# Patient Record
Sex: Male | Born: 1980 | Race: Black or African American | Hispanic: No | Marital: Single | Smoking: Former smoker
Health system: Southern US, Community
[De-identification: ages and names within clinical notes are randomized; demographics above are authoritative.]

## PROBLEM LIST (undated history)

## (undated) DIAGNOSIS — L089 Local infection of the skin and subcutaneous tissue, unspecified: Secondary | ICD-10-CM

## (undated) DIAGNOSIS — B958 Unspecified staphylococcus as the cause of diseases classified elsewhere: Secondary | ICD-10-CM

## (undated) DIAGNOSIS — S62609A Fracture of unspecified phalanx of unspecified finger, initial encounter for closed fracture: Secondary | ICD-10-CM

## (undated) DIAGNOSIS — T7840XA Allergy, unspecified, initial encounter: Secondary | ICD-10-CM

## (undated) DIAGNOSIS — I10 Essential (primary) hypertension: Secondary | ICD-10-CM

## (undated) HISTORY — DX: Allergy, unspecified, initial encounter: T78.40XA

---
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
## 2001-03-18 ENCOUNTER — Emergency Department (HOSPITAL_COMMUNITY): Admission: EM | Admit: 2001-03-18 | Discharge: 2001-03-18 | Payer: Self-pay | Admitting: *Deleted

## 2001-09-25 ENCOUNTER — Emergency Department (HOSPITAL_COMMUNITY): Admission: EM | Admit: 2001-09-25 | Discharge: 2001-09-25 | Payer: Self-pay | Admitting: Emergency Medicine

## 2001-12-10 ENCOUNTER — Emergency Department (HOSPITAL_COMMUNITY): Admission: EM | Admit: 2001-12-10 | Discharge: 2001-12-10 | Payer: Self-pay | Admitting: Emergency Medicine

## 2002-07-29 ENCOUNTER — Emergency Department (HOSPITAL_COMMUNITY): Admission: EM | Admit: 2002-07-29 | Discharge: 2002-07-29 | Payer: Self-pay | Admitting: Emergency Medicine

## 2002-08-27 ENCOUNTER — Emergency Department (HOSPITAL_COMMUNITY): Admission: AC | Admit: 2002-08-27 | Discharge: 2002-08-27 | Payer: Self-pay | Admitting: Emergency Medicine

## 2002-08-27 ENCOUNTER — Encounter: Payer: Self-pay | Admitting: Emergency Medicine

## 2003-05-18 ENCOUNTER — Emergency Department (HOSPITAL_COMMUNITY): Admission: EM | Admit: 2003-05-18 | Discharge: 2003-05-18 | Payer: Self-pay | Admitting: Emergency Medicine

## 2003-05-23 ENCOUNTER — Emergency Department (HOSPITAL_COMMUNITY): Admission: EM | Admit: 2003-05-23 | Discharge: 2003-05-23 | Payer: Self-pay

## 2003-09-21 ENCOUNTER — Emergency Department (HOSPITAL_COMMUNITY): Admission: EM | Admit: 2003-09-21 | Discharge: 2003-09-22 | Payer: Self-pay | Admitting: Emergency Medicine

## 2004-01-05 ENCOUNTER — Emergency Department (HOSPITAL_COMMUNITY): Admission: EM | Admit: 2004-01-05 | Discharge: 2004-01-05 | Payer: Self-pay | Admitting: Emergency Medicine

## 2004-03-05 ENCOUNTER — Emergency Department (HOSPITAL_COMMUNITY): Admission: EM | Admit: 2004-03-05 | Discharge: 2004-03-05 | Payer: Self-pay | Admitting: Emergency Medicine

## 2005-07-12 ENCOUNTER — Emergency Department (HOSPITAL_COMMUNITY): Admission: EM | Admit: 2005-07-12 | Discharge: 2005-07-12 | Payer: Self-pay | Admitting: Emergency Medicine

## 2005-07-13 ENCOUNTER — Emergency Department (HOSPITAL_COMMUNITY): Admission: EM | Admit: 2005-07-13 | Discharge: 2005-07-13 | Payer: Self-pay | Admitting: Emergency Medicine

## 2006-09-04 ENCOUNTER — Emergency Department (HOSPITAL_COMMUNITY): Admission: EM | Admit: 2006-09-04 | Discharge: 2006-09-04 | Payer: Self-pay | Admitting: Emergency Medicine

## 2006-10-28 ENCOUNTER — Emergency Department (HOSPITAL_COMMUNITY): Admission: EM | Admit: 2006-10-28 | Discharge: 2006-10-28 | Payer: Self-pay | Admitting: Emergency Medicine

## 2006-11-03 ENCOUNTER — Emergency Department (HOSPITAL_COMMUNITY): Admission: EM | Admit: 2006-11-03 | Discharge: 2006-11-03 | Payer: Self-pay | Admitting: Emergency Medicine

## 2006-11-25 ENCOUNTER — Emergency Department (HOSPITAL_COMMUNITY): Admission: EM | Admit: 2006-11-25 | Discharge: 2006-11-25 | Payer: Self-pay | Admitting: Emergency Medicine

## 2007-05-03 ENCOUNTER — Emergency Department (HOSPITAL_COMMUNITY): Admission: EM | Admit: 2007-05-03 | Discharge: 2007-05-03 | Payer: Self-pay | Admitting: Emergency Medicine

## 2007-07-23 ENCOUNTER — Emergency Department (HOSPITAL_COMMUNITY): Admission: EM | Admit: 2007-07-23 | Discharge: 2007-07-23 | Payer: Self-pay | Admitting: Emergency Medicine

## 2007-08-05 ENCOUNTER — Emergency Department (HOSPITAL_COMMUNITY): Admission: EM | Admit: 2007-08-05 | Discharge: 2007-08-05 | Payer: Self-pay | Admitting: Emergency Medicine

## 2007-08-24 ENCOUNTER — Emergency Department (HOSPITAL_COMMUNITY): Admission: EM | Admit: 2007-08-24 | Discharge: 2007-08-24 | Payer: Self-pay | Admitting: Emergency Medicine

## 2007-12-13 ENCOUNTER — Emergency Department (HOSPITAL_COMMUNITY): Admission: EM | Admit: 2007-12-13 | Discharge: 2007-12-13 | Payer: Self-pay | Admitting: Emergency Medicine

## 2008-02-05 ENCOUNTER — Emergency Department (HOSPITAL_COMMUNITY): Admission: EM | Admit: 2008-02-05 | Discharge: 2008-02-05 | Payer: Self-pay | Admitting: Emergency Medicine

## 2008-02-20 ENCOUNTER — Emergency Department (HOSPITAL_COMMUNITY): Admission: EM | Admit: 2008-02-20 | Discharge: 2008-02-20 | Payer: Self-pay | Admitting: Emergency Medicine

## 2009-01-26 IMAGING — CR DG FINGER LITTLE 2+V*R*
3 series · 3 of 3 positions shown · non-contrast
Comparison: None available.

CLINICAL DATA: Injury.  Pain.

RIGHT LITTLE FINGER 2+V

[x finger pa right]
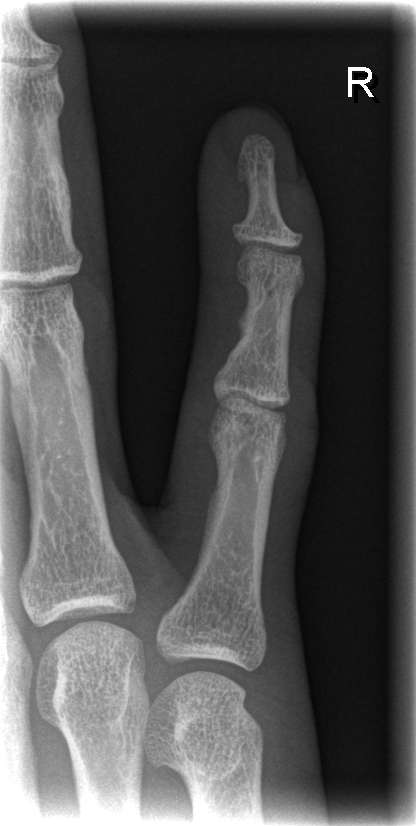

[x finger obl. right]
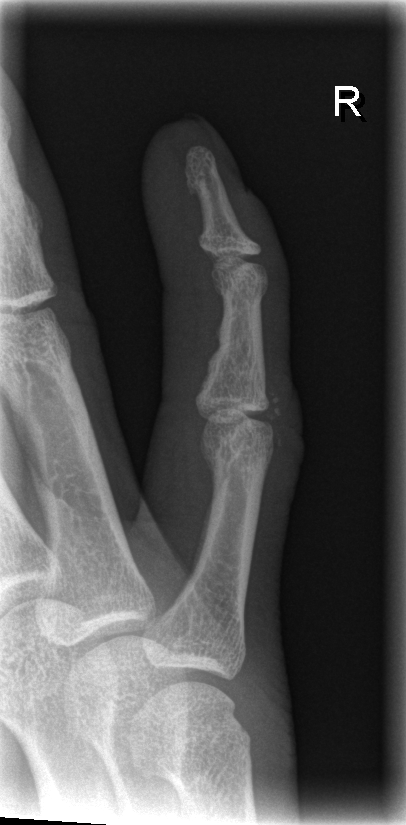

[x finger lateral right *]
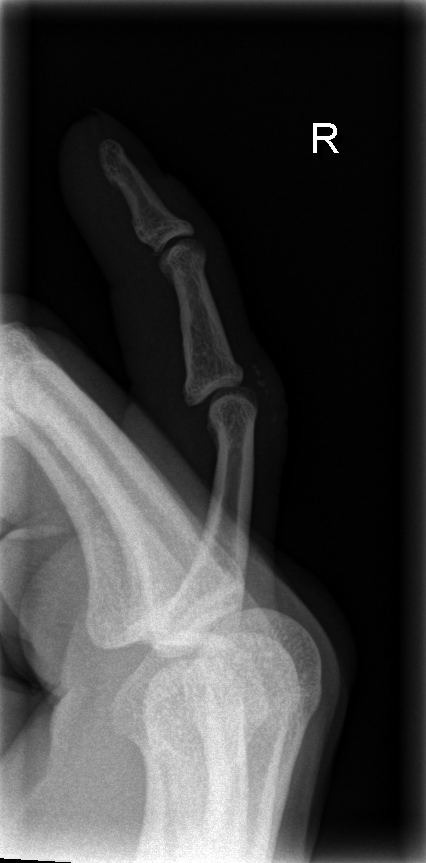

[3 of 3 positions shown; findings below may reference images not displayed]

FINDINGS: There is no fracture or dislocation.  Tiny radiopaque
densities are seen in the subcutaneous tissues of the dorsal aspect
of the PIP joint of the little finger.  These are most consistent
with remote trauma with note made that the patient had laceration
in this location based on report of prior plain film 07/08/1998.
IMPRESSION: No acute finding.

## 2009-03-29 ENCOUNTER — Emergency Department (HOSPITAL_COMMUNITY): Admission: EM | Admit: 2009-03-29 | Discharge: 2009-03-29 | Payer: Self-pay | Admitting: Emergency Medicine

## 2009-04-15 ENCOUNTER — Emergency Department (HOSPITAL_COMMUNITY): Admission: EM | Admit: 2009-04-15 | Discharge: 2009-04-15 | Payer: Self-pay | Admitting: Emergency Medicine

## 2009-05-29 ENCOUNTER — Emergency Department (HOSPITAL_COMMUNITY): Admission: EM | Admit: 2009-05-29 | Discharge: 2009-05-29 | Payer: Self-pay | Admitting: Emergency Medicine

## 2010-06-09 LAB — GC/CHLAMYDIA PROBE AMP, GENITAL: Chlamydia, DNA Probe: NEGATIVE

## 2010-12-13 LAB — RAPID STREP SCREEN (MED CTR MEBANE ONLY): Streptococcus, Group A Screen (Direct): POSITIVE — AB

## 2010-12-18 LAB — GC/CHLAMYDIA PROBE AMP, GENITAL
Chlamydia, DNA Probe: NEGATIVE
GC Probe Amp, Genital: POSITIVE — AB

## 2010-12-23 LAB — DIFFERENTIAL
Basophils Relative: 0
Eosinophils Absolute: 0
Eosinophils Relative: 0
Lymphs Abs: 0.6 — ABNORMAL LOW
Monocytes Absolute: 0.2
Monocytes Relative: 2 — ABNORMAL LOW
Neutrophils Relative %: 93 — ABNORMAL HIGH

## 2010-12-23 LAB — ETHANOL: Alcohol, Ethyl (B): 10

## 2010-12-23 LAB — CBC
Hemoglobin: 14.9
RDW: 14

## 2010-12-23 LAB — COMPREHENSIVE METABOLIC PANEL
ALT: 20
AST: 37
Albumin: 4.3
Alkaline Phosphatase: 89
Calcium: 9.1
GFR calc Af Amer: >60
Glucose, Bld: 152 — ABNORMAL HIGH
Potassium: 4.1
Sodium: 133 — ABNORMAL LOW
Total Protein: 7.4

## 2010-12-24 LAB — URINALYSIS, ROUTINE W REFLEX MICROSCOPIC
Bilirubin Urine: NEGATIVE
Glucose, UA: NEGATIVE mg/dL
Hgb urine dipstick: NEGATIVE
Ketones, ur: NEGATIVE mg/dL
Protein, ur: NEGATIVE mg/dL
pH: 7 (ref 5.0–8.0)

## 2010-12-24 LAB — URINE MICROSCOPIC-ADD ON

## 2010-12-24 LAB — GC/CHLAMYDIA PROBE AMP, GENITAL: GC Probe Amp, Genital: POSITIVE — AB

## 2011-10-07 ENCOUNTER — Emergency Department (HOSPITAL_COMMUNITY)
Admission: EM | Admit: 2011-10-07 | Discharge: 2011-10-07 | Disposition: A | Discharge status: Home / Self Care | Payer: Self-pay | Attending: Emergency Medicine | Admitting: Emergency Medicine

## 2011-10-07 ENCOUNTER — Encounter (HOSPITAL_COMMUNITY): Payer: Self-pay | Admitting: Emergency Medicine

## 2011-10-07 DIAGNOSIS — F17200 Nicotine dependence, unspecified, uncomplicated: Secondary | ICD-10-CM | POA: Insufficient documentation

## 2011-10-07 DIAGNOSIS — A088 Other specified intestinal infections: Secondary | ICD-10-CM | POA: Insufficient documentation

## 2011-10-07 DIAGNOSIS — A084 Viral intestinal infection, unspecified: Secondary | ICD-10-CM

## 2011-10-07 HISTORY — DX: Unspecified staphylococcus as the cause of diseases classified elsewhere: B95.8

## 2011-10-07 HISTORY — DX: Fracture of unspecified phalanx of unspecified finger, initial encounter for closed fracture: S62.609A

## 2011-10-07 HISTORY — DX: Local infection of the skin and subcutaneous tissue, unspecified: L08.9

## 2011-10-07 LAB — BASIC METABOLIC PANEL
Calcium: 9.9 mg/dL (ref 8.4–10.5)
GFR calc Af Amer: >90 mL/min (ref >90)
GFR calc non Af Amer: >90 mL/min (ref >90)
Glucose, Bld: 102 mg/dL — ABNORMAL HIGH (ref 70–99)
Potassium: 3.8 mEq/L (ref 3.5–5.1)
Sodium: 136 mEq/L (ref 135–145)

## 2011-10-07 MED ORDER — ONDANSETRON HCL 4 MG/2ML IJ SOLN
4.0000 mg | Freq: Once | INTRAMUSCULAR | Status: AC
  Administered 2011-10-07: 4 mg via INTRAVENOUS
  Filled 2011-10-07: qty 2

## 2011-10-07 MED ORDER — SODIUM CHLORIDE 0.9 % IV BOLUS (SEPSIS)
1000.0000 mL | Freq: Once | INTRAVENOUS | Status: AC
  Administered 2011-10-07: 1000 mL via INTRAVENOUS

## 2011-10-07 MED ORDER — PROMETHAZINE HCL 12.5 MG PO TABS
12.5000 mg | ORAL_TABLET | Freq: Four times a day (QID) | ORAL | Status: DC | PRN
Start: 2011-10-07 — End: 2011-10-20

## 2011-10-07 NOTE — ED Provider Notes (Signed)
History     CSN: [id number]  Arrival date & time 10/07/11  1106   First MD Initiated Contact with Patient 10/07/11 1206      No chief complaint on file.   (Consider location/radiation/quality/duration/timing/severity/associated sxs/prior treatment) The history is provided by the patient.    31 year old male with a  4 days history of nausea, vomiting, and diarrhea. The vomiting is non-bloody and non-bilious. The stool is non-bloody. His symptoms are exacerbated by eating and relieved by nothing. He denies fever. Multiple family members have similar symptoms.   Past Medical History  Diagnosis Date  . Broken finger 2001  . Staph skin infection 2001    need surgical debridment and IV antibiotics     History reviewed. No pertinent past surgical history.  No family history on file.  History  Substance Use Topics  . Smoking status: Current Everyday Smoker -- 0.5 packs/day  . Smokeless tobacco: Not on file  . Alcohol Use: No      Review of Systems  All other systems reviewed and are negative.    Allergies  Review of patient's allergies indicates no known allergies.  Home Medications  No current outpatient prescriptions on file.  BP 135/78  Pulse 59  Temp 97.5 F (36.4 C) (Oral)  Resp 16  SpO2 98%  Physical Exam  Vitals reviewed. Constitutional: He is oriented to person, place, and time. He appears well-developed and well-nourished. No distress.  HENT:  Head: Normocephalic and atraumatic.  Mouth/Throat: No oropharyngeal exudate.       Mucous membrane dry  Eyes: Conjunctivae and EOM are normal. Pupils are equal, round, and reactive to light.  Neck: Normal range of motion. Neck supple.  Cardiovascular: Normal rate, regular rhythm and intact distal pulses.        Cap refill< 2 sec  Pulmonary/Chest: Effort normal and breath sounds normal.  Abdominal: Soft. Bowel sounds are normal. He exhibits no distension. There is no tenderness.  Neurological: He is alert and  oriented to person, place, and time.  Skin: Skin is warm and dry. He is not diaphoretic.  Psychiatric: He has a normal mood and affect. His behavior is normal.    ED Course  Procedures (including critical care time)   Labs Reviewed  BASIC METABOLIC PANEL   No results found.   1. Viral gastroenteritis       MDM  The patient presents with symptoms consistent with viral gastroenteritis, which was treated with zofran and IV fluids. He had no electrolyte abnormalities or evidence of acute kidney injury as a result of the dehydration. He is stable and appropriate for outpatient management with anti-nausea medication and consistent PO intake. The patient was in agreement with this plan.         Garnetta Buddy, MD 10/07/11 1447

## 2011-10-07 NOTE — ED Provider Notes (Signed)
I saw and evaluated the patient, reviewed the resident's note and I agree with the findings and plan.  The patient's symptoms seem consistent with a viral gastroenteritis.  Discharged home in good condition  Lyanne Co, MD 10/07/11 1626

## 2011-10-07 NOTE — ED Notes (Signed)
Pt complaining of nausea, vomiting, and diarrhea that started early this morning. Pt's companion reports family members have had similar symptoms.

## 2011-10-08 MED ORDER — FENTANYL CITRATE 0.05 MG/ML IJ SOLN
INTRAMUSCULAR | Status: AC
  Filled 2011-10-08: qty 2

## 2011-10-20 ENCOUNTER — Encounter (HOSPITAL_COMMUNITY): Payer: Self-pay | Admitting: Emergency Medicine

## 2011-10-20 ENCOUNTER — Emergency Department (HOSPITAL_COMMUNITY)
Admission: EM | Admit: 2011-10-20 | Discharge: 2011-10-20 | Disposition: A | Discharge status: Home / Self Care | Payer: No Typology Code available for payment source | Attending: Emergency Medicine | Admitting: Emergency Medicine

## 2011-10-20 DIAGNOSIS — IMO0002 Reserved for concepts with insufficient information to code with codable children: Secondary | ICD-10-CM | POA: Insufficient documentation

## 2011-10-20 DIAGNOSIS — F17200 Nicotine dependence, unspecified, uncomplicated: Secondary | ICD-10-CM | POA: Insufficient documentation

## 2011-10-20 DIAGNOSIS — Y93I9 Activity, other involving external motion: Secondary | ICD-10-CM | POA: Insufficient documentation

## 2011-10-20 DIAGNOSIS — Y998 Other external cause status: Secondary | ICD-10-CM | POA: Insufficient documentation

## 2011-10-20 DIAGNOSIS — S46912A Strain of unspecified muscle, fascia and tendon at shoulder and upper arm level, left arm, initial encounter: Secondary | ICD-10-CM

## 2011-10-20 MED ORDER — CYCLOBENZAPRINE HCL 10 MG PO TABS
10.0000 mg | ORAL_TABLET | Freq: Two times a day (BID) | ORAL | Status: DC | PRN
Start: 2011-10-20 — End: 2011-10-20

## 2011-10-20 MED ORDER — IBUPROFEN 600 MG PO TABS: 600.0000 mg | ORAL_TABLET | Freq: Four times a day (QID) | ORAL | Status: DC | PRN

## 2011-10-20 MED ORDER — IBUPROFEN 600 MG PO TABS: 600.0000 mg | ORAL_TABLET | Freq: Four times a day (QID) | ORAL | Status: AC | PRN

## 2011-10-20 MED ORDER — CYCLOBENZAPRINE HCL 10 MG PO TABS: 10.0000 mg | ORAL_TABLET | Freq: Two times a day (BID) | ORAL | Status: DC | PRN

## 2011-10-20 MED ORDER — CYCLOBENZAPRINE HCL 10 MG PO TABS: 10.0000 mg | ORAL_TABLET | Freq: Two times a day (BID) | ORAL | Status: AC | PRN
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
## 2011-10-20 NOTE — ED Provider Notes (Signed)
Medical screening examination/treatment/procedure(s) were performed by non-physician practitioner and as supervising physician I was immediately available for consultation/collaboration.    Nelia Shi, MD 10/20/11 1325

## 2013-11-11 ENCOUNTER — Ambulatory Visit (INDEPENDENT_AMBULATORY_CARE_PROVIDER_SITE_OTHER): Payer: BC Managed Care – PPO | Admitting: Family Medicine

## 2013-11-11 VITALS — BP 110/68 | HR 50 | Temp 98.0°F | Resp 16 | Ht 69.75 in | Wt 186.4 lb

## 2013-11-11 DIAGNOSIS — L02818 Cutaneous abscess of other sites: Secondary | ICD-10-CM

## 2013-11-11 DIAGNOSIS — L03818 Cellulitis of other sites: Secondary | ICD-10-CM

## 2013-11-11 MED ORDER — DOXYCYCLINE HYCLATE 100 MG PO TABS: 100.0000 mg | ORAL_TABLET | Freq: Two times a day (BID) | ORAL | Status: DC

## 2013-11-11 NOTE — Progress Notes (Signed)
33 year old gentleman who experienced left foot pain last night spontaneously. He thinks he was bitten by something. When he woke up this morning he found that he had a three-quarter centimeter pustule on the instep of his left foot.  Patient works at [employer]. He wants to continue working today.  Objective: Patient has a 1 cm pustule on the instep of his left foot which was I&D after sterilization with isopropyl alcohol.  Culture was obtained.  There is minimal erythema around the edge of the pustule.  Assessment: Early abscess with cellulitis localized to the instep of the left foot  Plan: .Cellulitis of other specified site - Plan: Wound culture, doxycycline (VIBRA-TABS) 100 MG tablet  Signed, Elvina SidleKurt Damont Balles, MD

## 2013-11-14 LAB — WOUND CULTURE: Gram Stain: NONE SEEN

## 2014-04-23 ENCOUNTER — Ambulatory Visit (INDEPENDENT_AMBULATORY_CARE_PROVIDER_SITE_OTHER): Payer: BLUE CROSS/BLUE SHIELD | Admitting: Family Medicine

## 2014-04-23 ENCOUNTER — Ambulatory Visit (INDEPENDENT_AMBULATORY_CARE_PROVIDER_SITE_OTHER): Payer: BLUE CROSS/BLUE SHIELD

## 2014-04-23 VITALS — BP 126/88 | HR 65 | Temp 98.1°F | Resp 19 | Ht 69.75 in | Wt 186.6 lb

## 2014-04-23 DIAGNOSIS — Z0000 Encounter for general adult medical examination without abnormal findings: Secondary | ICD-10-CM

## 2014-04-23 DIAGNOSIS — R079 Chest pain, unspecified: Secondary | ICD-10-CM

## 2014-04-23 LAB — POCT URINALYSIS DIPSTICK
Bilirubin, UA: NEGATIVE
Blood, UA: NEGATIVE
Glucose, UA: NEGATIVE
Ketones, UA: NEGATIVE
Leukocytes, UA: NEGATIVE
Nitrite, UA: NEGATIVE
Protein, UA: NEGATIVE
Spec Grav, UA: 1.015
Urobilinogen, UA: 1
pH, UA: 7

## 2014-04-23 LAB — LIPID PANEL
Cholesterol: 160 mg/dL (ref 0–200)
HDL: 43 mg/dL (ref >39)
LDL Cholesterol: 105 mg/dL — ABNORMAL HIGH (ref 0–99)
Total CHOL/HDL Ratio: 3.7 Ratio
Triglycerides: 61 mg/dL (ref <150)
VLDL: 12 mg/dL (ref 0–40)

## 2014-04-23 LAB — POCT CBC
Granulocyte percent: 58.7 % (ref 37–80)
HCT, POC: 46.3 % (ref 43.5–53.7)
Hemoglobin: 14 g/dL — AB (ref 14.1–18.1)
Lymph, poc: 1.6 (ref 0.6–3.4)
MCH, POC: 27.7 pg (ref 27–31.2)
MCHC: 30.4 g/dL — AB (ref 31.8–35.4)
MCV: 91.3 fL (ref 80–97)
MID (cbc): 0.2 (ref 0–0.9)
MPV: 9.6 fL (ref 0–99.8)
POC Granulocyte: 2.5 (ref 2–6.9)
POC LYMPH PERCENT: 37.3 % (ref 10–50)
POC MID %: 4 % (ref 0–12)
Platelet Count, POC: 113 10*3/uL — AB (ref 142–424)
RBC: 5.06 M/uL (ref 4.69–6.13)
RDW, POC: 16.7 %
WBC: 4.3 10*3/uL — AB (ref 4.6–10.2)

## 2014-04-23 LAB — COMPREHENSIVE METABOLIC PANEL WITH GFR
ALT: 9 U/L (ref 0–53)
AST: 14 U/L (ref 0–37)
Albumin: 4.3 g/dL (ref 3.5–5.2)
Alkaline Phosphatase: 59 U/L (ref 39–117)
BUN: 14 mg/dL (ref 6–23)
CO2: 30 meq/L (ref 19–32)
Calcium: 9.8 mg/dL (ref 8.4–10.5)
Chloride: 104 meq/L (ref 96–112)
Creat: 1.06 mg/dL (ref 0.50–1.35)
Glucose, Bld: 89 mg/dL (ref 70–99)
Potassium: 4.6 meq/L (ref 3.5–5.3)
Sodium: 139 meq/L (ref 135–145)
Total Bilirubin: 0.6 mg/dL (ref 0.2–1.2)
Total Protein: 6.8 g/dL (ref 6.0–8.3)

## 2014-04-23 LAB — TSH: TSH: 0.65 u[IU]/mL (ref 0.350–4.500)

## 2014-04-23 MED ORDER — PREDNISONE 20 MG PO TABS: 40.0000 mg | ORAL_TABLET | Freq: Every day | ORAL | Status: DC

## 2014-04-23 NOTE — Patient Instructions (Signed)

## 2014-04-23 NOTE — Progress Notes (Signed)
Subjective:    Patient ID: DAJOUR PIERPOINT, male    DOB: December 01, 1980, 34 y.o.   MRN: [id number] This chart was scribed for Elvina Sidle, MD by Littie Deeds, Medical Scribe. This patient was seen in Room 11 and the patient's care was started at 12:48 PM.   HPI HPI Comments: NICLAS MARKELL is a 34 y.o. male who presents to the [facility] for a complete physical exam. Patient reports having gradual onset, intermittent, sharp chest pain that became more frequent 1 month ago. He also reports having associated fatigue. He states he gets episodes of chest pain at least once a day. The pain is worsened with breathing, and he states he has difficulty getting good, deep breaths and will have to take small breaths. He cannot associate his pain with any activities, food intake, or position. The pain has never woken him up from his sleep. Patient does smoke, but he is working on quitting - he called a smoking hotline yesterday. He denies leg pain and leg swelling. He also denies having any medical problems. Patient does note that he had a heart murmur as a child that he outgrew - he has not had an [place] of his heart. His mother was recently diagnosed with DM and she also has hx of HTN. Patient does not exercise regularly.  Patient works for [employer] and works overtime sometimes, but does not work on the [employer] (Saturday). He is currently married with children. His wife is a Production designer, theatre/television/film of an Teaching laboratory technician.  Review of Systems  Constitutional: Positive for fatigue.  Respiratory: Positive for shortness of breath.   Cardiovascular: Positive for chest pain. Negative for leg swelling.       Objective:   Physical Exam CONSTITUTIONAL: Well developed/well nourished HEAD: Normocephalic/atraumatic EYES: EOM/PERRL ENMT: Mucous membranes moist NECK: Slight fullness in thyroid area SPINE: entire spine nontender CV: S1/S2 noted, no murmurs/rubs/gallops noted LUNGS: Lungs are clear to  auscultation bilaterally, no apparent distress ABDOMEN: soft, nontender, no rebound or guarding GU: no cva tenderness NEURO: Pt is awake/alert, moves all extremitiesx4 EXTREMITIES: pulses normal, full ROM SKIN: warm, color normal PSYCH: no abnormalities of mood noted  [facility] reading (PRIMARY) by  Dr. Milus Glazier normal chest film  EKG: Normal sinus rhythm  Results for orders placed or performed in visit on 04/23/14  POCT CBC  Result Value Ref Range   WBC 4.3 (A) 4.6 - 10.2 K/uL   Lymph, poc 1.6 0.6 - 3.4   POC LYMPH PERCENT 37.3 10 - 50 %L   MID (cbc) 0.2 0 - 0.9   POC MID % 4.0 0 - 12 %M   POC Granulocyte 2.5 2 - 6.9   Granulocyte percent 58.7 37 - 80 %G   RBC 5.06 4.69 - 6.13 M/uL   Hemoglobin 14.0 (A) 14.1 - 18.1 g/dL   HCT, POC 40.9 81.1 - 53.7 %   MCV 91.3 80 - 97 fL   MCH, POC 27.7 27 - 31.2 pg   MCHC 30.4 (A) 31.8 - 35.4 g/dL   RDW, POC 91.4 %   Platelet Count, POC 113 (A) 142 - 424 K/uL   MPV 9.6 0 - 99.8 fL  POCT urinalysis dipstick  Result Value Ref Range   Color, UA yellow    Clarity, UA clear    Glucose, UA neg    Bilirubin, UA neg    Ketones, UA neg    Spec Grav, UA 1.015    Blood, UA  neg    pH, UA 7.0    Protein, UA neg    Urobilinogen, UA 1.0    Nitrite, UA neg    Leukocytes, UA Negative        Assessment & Plan:   I think the intermittent chest pains are coming from bronchial irritation. I'm pleased disease contacted [phone] quit now and getting started on nicotine substitutes.  This chart was scribed in my presence and reviewed by me personally.    ICD-9-CM ICD-10-CM   1. Annual physical exam V70.0 Z00.00 POCT CBC     Comprehensive metabolic panel     POCT urinalysis dipstick     Lipid panel     TSH     PSA  2. Chest pain, unspecified chest pain type 786.50 R07.9 EKG 12-Lead     EKG 12-Lead     DG Chest 2 View     predniSONE (DELTASONE) 20 MG tablet     Signed, Elvina SidleKurt Lauenstein, MD

## 2014-04-24 LAB — PSA: PSA: 1.87 ng/mL (ref <=4.00)

## 2014-04-25 ENCOUNTER — Encounter: Payer: Self-pay | Admitting: Family Medicine

## 2014-06-05 ENCOUNTER — Other Ambulatory Visit: Payer: Self-pay | Admitting: Family Medicine

## 2014-06-05 ENCOUNTER — Telehealth: Payer: Self-pay

## 2014-06-05 DIAGNOSIS — Z0000 Encounter for general adult medical examination without abnormal findings: Secondary | ICD-10-CM

## 2014-06-05 NOTE — Telephone Encounter (Signed)
Patient had his cpe with Dr Milus Glazierlauenstein on 04/23/14 and he forgot to ask him to order fasting labs/std testing. Patient is requesting Dr Milus GlazierLauenstein to put in an order so he can return to the walk in and not have to wait to be seen if possible. Patients call back number is [phone]

## 2014-06-12 ENCOUNTER — Ambulatory Visit (INDEPENDENT_AMBULATORY_CARE_PROVIDER_SITE_OTHER): Payer: BLUE CROSS/BLUE SHIELD | Admitting: Family Medicine

## 2014-06-12 ENCOUNTER — Encounter: Payer: Self-pay | Admitting: Urgent Care

## 2014-06-12 ENCOUNTER — Ambulatory Visit (INDEPENDENT_AMBULATORY_CARE_PROVIDER_SITE_OTHER): Payer: BLUE CROSS/BLUE SHIELD

## 2014-06-12 VITALS — BP 132/83 | HR 60 | Temp 98.1°F | Resp 16 | Ht 71.0 in | Wt 184.2 lb

## 2014-06-12 DIAGNOSIS — S59901A Unspecified injury of right elbow, initial encounter: Secondary | ICD-10-CM

## 2014-06-12 DIAGNOSIS — A630 Anogenital (venereal) warts: Secondary | ICD-10-CM

## 2014-06-12 DIAGNOSIS — S5011XA Contusion of right forearm, initial encounter: Secondary | ICD-10-CM | POA: Diagnosis not present

## 2014-06-12 DIAGNOSIS — M25521 Pain in right elbow: Secondary | ICD-10-CM | POA: Diagnosis not present

## 2014-06-12 MED ORDER — IMIQUIMOD 5 % EX CREA: TOPICAL_CREAM | CUTANEOUS | Status: DC

## 2014-06-12 MED ORDER — MELOXICAM 7.5 MG PO TABS: 7.5000 mg | ORAL_TABLET | Freq: Every day | ORAL | Status: DC

## 2014-06-12 NOTE — Progress Notes (Signed)
MRN: [id number] DOB: 06-25-1980  Subjective:   NIL Andrew [name] is a 34 y.o. male presenting for chief complaint of Injury to right elbow and genital warts  Elbow injury - patient reports 5 day history of right elbow injury sustained while at work. Patient works in [employer], bends vehicle bumpers, was pulling down on a lever with both hands, slammed right elbow down onto metal. Patient was seen immediately by a nurse at work, helped ice elbow, took ibuprofen with some relief. Today he reports his pain is mild, rated 2/10, constant, worse with touch, non-radiating associated with swelling, mild erythema. Denies fevers, decreased ROM, decreased sensation or strength, numbness or tingling. Of note, patient has tried to continue with his normal routine including exercises such as push-ups. He admits he has not needed any ibuprofen for pain but would like to make sure he does not have a fracture or dislocation.  Genital warts - [facility], was diagnosed with genital warts, treated salicylic acid and resolved. Today, reports that 1 wart returned ~1 year ago. Reports some itching, occasional clear drainage followed by scabbing. Patient has not used anything to treat this current episode, it has steadily increased in size over the past year. He denies penile drainage, bleeding, rashes otherwise.   Denies smoking, quit 05/28/2013, does not use alcohol. Denies any other aggravating or relieving factors, no other questions or concerns.  Andrew [name] currently has no medications in their medication list. He is allergic to bee venom.  Andrew [name]  has a past medical history of Broken finger (2001); Staph skin infection (2001); and Allergy. Also  has no past surgical history on file.  ROS As in subjective.  Objective:   Vitals: BP 132/83 mmHg  Pulse 60  Temp(Src) 98.1 F (36.7 C) (Oral)  Resp 16  Ht  (1.803 m)  Wt 184 lb 3.2 oz (83.553 kg)  BMI 25.70  kg/m2  SpO2 100%  Physical Exam  Constitutional: He is oriented to person, place, and time and well-developed, well-nourished, and in no distress.  Cardiovascular: Normal rate.   Pulmonary/Chest: Effort normal.  Genitourinary:  Patient declined check for anal warts.  Musculoskeletal:       Right elbow: He exhibits swelling (over olecranon process). He exhibits normal range of motion (full active and passive ROM), no deformity and no laceration. Tenderness found. Olecranon process tenderness noted. No radial head, no medial epicondyle and no lateral epicondyle tenderness noted.  Right arm strength 5/5, sensation intact.  Neurological: He is alert and oriented to person, place, and time.  Skin: Skin is warm and dry. Rash (Depicted: ~1.5cm cauliflower like lesion near right shaft base of penis. There are also 3 other smaller cauliflower lesions <0.5cm in size in surrounding groin area) noted. No erythema. No pallor.      [facility] reading (PRIMARY) by  Dr. Neva Seat and PA-Maralyn Witherell. Right elbow: avulsion fracture of olecranon process, mild internal fat pad sign.   Assessment and Plan :   1. Elbow injury, right, initial encounter 2. Olecranon fracture, right, closed, initial encounter 3. Elbow pain, right - Stable avulsion fracture of olecranon process. Recommended patient pursue treatment through worker's compensation. Otherwise, patient is to use arm sling while at work that allows for extension up ~135 degrees. Advised to avoid lifting over 10lbs, full extension. Meloxicam 7.5mg  daily for pain. - Follow up through worker's compensation or with me in 1 week, consider referral to orthopedics if no resolution or  worsening of symptoms.  4. Genital warts - Stable, offered patient Imiquimod therapy for smaller lesions, patient declined this. - Agreed to referral to dermatology for management and surgical treatment of larger genital wart.  Wallis BambergMario Devinne Epstein, PA-C [facility] [phone] 06/12/2014 2:30 PM

## 2014-06-12 NOTE — Patient Instructions (Addendum)
For you elbow injury, please make sure that you contact your employer and go through worker's compensation.    Human Papillomavirus Human papillomavirus (HPV) is the most common sexually transmitted infection (STI) and is highly contagious. HPV infections cause genital warts and cancers to the outlet of the womb (cervix), birth canal (vagina), opening of the birth canal (vulva), and anus. There are over 100 types of HPV. Four types of HPV are responsible for causing 70% of all cervical cancers. Ninety percent of anal cancers and genital warts are caused by HPV. Unless you have wart-like lesions in the throat or genital warts that you can see or feel, HPV usually does not cause symptoms. Therefore, people can be infected for long periods and pass it on to others without knowing it. HPV in pregnancy usually does not cause a problem for the mother or baby. If the mother has genital warts, the baby rarely gets infected. When the HPV infection is found to be pre-cancerous on the cervix, vagina, or vulva, the mother will be followed closely during the pregnancy. Any needed treatment will be done after the baby is born. CAUSES   Having unprotected sex. HPV can be spread by oral, vaginal, or anal sexual activity.  Having several sex partners.  Having a sex partner who has other sex partners.  Having or having had another sexually transmitted infection. SYMPTOMS   More than 90% of people carrying HPV cannot tell anything is wrong.  Wart-like lesions in the throat (from having oral sex).  Warts in the infected skin or mucous membranes.  Genital warts may itch, burn, or bleed.  Genital warts may be painful or bleed during sexual intercourse. DIAGNOSIS   Genital warts are easily seen with the naked eye.  Currently, there is no FDA-approved test to detect HPV in males.  In females, a Pap test can show cells which are infected with HPV.  In females, a scope can be used to view the cervix  (colposcopy). A colposcopy can be performed if the pelvic exam or Pap test is abnormal.  In females, a sample of tissue may be removed (biopsy) during the colposcopy. TREATMENT   Treatment of genital warts can include:  Podophyllin lotion or gel.  Bichloroacetic acid (BCA) or trichloroacetic acid (TCA).  Podofilox solution or gel.  Imiquimod cream.  Interferon injections.  Use of a probe to apply extreme cold (cryotherapy).  Application of an intense beam of light (laser treatment).  Use of a probe to apply extreme heat (electrocautery).  Surgery.  HPV of the cervix, vagina, or vulva can be treated with:  Cryotherapy.  Laser treatment.  Electrocautery.  Surgery. Your caregiver will follow you closely after you are treated. This is because the HPV can come back and may need treatment again. HOME CARE INSTRUCTIONS   Follow your caregiver's instructions regarding medications, Pap tests, and follow-up exams.  Do not touch or scratch the warts.  Do not treat genital warts with medication used for treating hand warts.  Tell your sex partner about your infection because he or she may also need treatment.  Do not have sex while you are being treated.  After treatment, use condoms during sex to prevent future infections.  Have only 1 sex partner.  Have a sex partner who does not have other sex partners.  Use over-the-counter creams for itching or irritation as directed by your caregiver.  Use over-the-counter or prescription medicines for pain, discomfort, or fever as directed by your caregiver.  Do  not douche or use tampons during treatment of HPV. PREVENTION   Talk to your caregiver about getting the HPV vaccines. These vaccines prevent some HPV infections and cancers. It is recommended that the vaccine be given to males and females between the age of 36 and 37 years old. It will not work if you already have HPV and it is not recommended for pregnant women. The  vaccines are not recommended for pregnant women.  Call your caregiver if you think you are pregnant and have the HPV.  A PAP test is done to screen for cervical cancer.  The first PAP test should be done at age 71.  Between ages 36 and 79, PAP tests are repeated every 2 years.  Beginning at age 35, you are advised to have a PAP test every 3 years as long as your past 3 PAP tests have been normal.  Some women have medical problems that increase the chance of getting cervical cancer. Talk to your caregiver about these problems. It is especially important to talk to your caregiver if a new problem develops soon after your last PAP test. In these cases, your caregiver may recommend more frequent screening and Pap tests.  The above recommendations are the same for women who have or have not gotten the vaccine for HPV (Human Papillomavirus).  If you had a hysterectomy for a problem that was not a cancer or a condition that could lead to cancer, then you no longer need Pap tests. However, even if you no longer need a PAP test, a regular exam is a good idea to make sure no other problems are starting.   If you are between ages 60 and 42, and you have had normal Pap tests going back 10 years, you no longer need Pap tests. However, even if you no longer need a PAP test, a regular exam is a good idea to make sure no other problems are starting.  If you have had past treatment for cervical cancer or a condition that could lead to cancer, you need Pap tests and screening for cancer for at least 20 years after your treatment.  If Pap tests have been discontinued, risk factors (such as a new sexual partner)need to be re-assessed to determine if screening should be resumed.  Some women may need screenings more often if they are at high risk for cervical cancer. SEEK MEDICAL CARE IF:   The treated skin becomes red, swollen or painful.  You have an oral temperature above 102 F (38.9 C).  You feel  generally ill.  You feel lumps or pimple-like projections in and around your genital area.  You develop bleeding of the vagina or the treatment area.  You develop painful sexual intercourse. Document Released: 05/31/2003 Document Revised: 06/02/2011 Document Reviewed: 06/15/2013 [facility] Patient Information 2015 [place], [place]. This information is not intended to replace advice given to you by your health care provider. Make sure you discuss any questions you have with your health care provider.     Genital Warts Genital warts are a sexually transmitted infection. They may appear as small bumps on the tissues of the genital area. CAUSES  Genital warts are caused by a virus called human papillomavirus (HPV). HPV is the most common sexually transmitted disease (STD) and infection of the sex organs. This infection is spread by having unprotected sex with an infected person. It can be spread by vaginal, anal, and oral sex. Many people do not know they are infected. They may  be infected for years without problems. However, even if they do not have problems, they can unknowingly pass the infection to their sexual partners. SYMPTOMS   Itching and irritation in the genital area.  Warts that bleed.  Painful sexual intercourse. DIAGNOSIS  Warts are usually recognized with the naked eye on the vagina, vulva, perineum, anus, and rectum. Certain tests can also diagnose genital warts, such as:  A Pap test.  A tissue sample (biopsy) exam.  Colposcopy. A magnifying tool is used to examine the vagina and cervix. The HPV cells will change color when certain solutions are used. TREATMENT  Warts can be removed by:  Applying certain chemicals, such as cantharidin or podophyllin.  Liquid nitrogen freezing (cryotherapy).  Immunotherapy with Candida or Trichophyton injections.  Laser treatment.  Burning with an electrified probe (electrocautery).  Interferon injections.  Surgery. PREVENTION    HPV vaccination can help prevent HPV infections that cause genital warts and that cause cancer of the cervix. It is recommended that the vaccination be given to people between the ages 429 to 34 years old. The vaccine might not work as well or might not work at all if you already have HPV. It should not be given to pregnant women. HOME CARE INSTRUCTIONS   It is important to follow your caregiver's instructions. The warts will not go away without treatment. Repeat treatments are often needed to get rid of warts. Even after it appears that the warts are gone, the normal tissue underneath often remains infected.  Do not try to treat genital warts with medicine used to treat hand warts. This type of medicine is strong and can burn the skin in the genital area, causing more damage.  Tell your past and current sexual partner(s) that you have genital warts. They may be infected also and need treatment.  Avoid sexual contact while being treated.  Do not touch or scratch the warts. The infection may spread to other parts of your body.  Women with genital warts should have a cervical cancer check (Pap test) at least once a year. This type of cancer is slow-growing and can be cured if found early. Chances of developing cervical cancer are increased with HPV.  Inform your obstetrician about your warts in the event of pregnancy. This virus can be passed to the baby's respiratory tract. Discuss this with your caregiver.  Use a condom during sexual intercourse. Following treatment, the use of condoms will help prevent reinfection.  Ask your caregiver about using over-the-counter anti-itch creams. SEEK MEDICAL CARE IF:   Your treated skin becomes red, swollen, or painful.  You have a fever.  You feel generally ill.  You feel little lumps in and around your genital area.  You are bleeding or have painful sexual intercourse. MAKE SURE YOU:   Understand these instructions.  Will watch your  condition.  Will get help right away if you are not doing well or get worse. Document Released: 03/07/2000 Document Revised: 07/25/2013 Document Reviewed: 09/16/2010 [facility] Patient Information 2015 [place]. This information is not intended to replace advice given to you by your health care provider. Make sure you discuss any questions you have with your health care provider.

## 2014-06-15 ENCOUNTER — Telehealth: Payer: Self-pay | Admitting: Physician Assistant

## 2014-06-15 NOTE — Telephone Encounter (Signed)
Clarified. Again.

## 2014-06-15 NOTE — Telephone Encounter (Signed)
Clarified restrictions.

## 2014-06-19 ENCOUNTER — Ambulatory Visit (INDEPENDENT_AMBULATORY_CARE_PROVIDER_SITE_OTHER): Payer: BLUE CROSS/BLUE SHIELD | Admitting: Physician Assistant

## 2014-06-19 ENCOUNTER — Other Ambulatory Visit: Payer: BLUE CROSS/BLUE SHIELD

## 2014-06-19 VITALS — BP 122/76 | HR 66 | Temp 97.9°F | Resp 16 | Ht 71.0 in | Wt 181.2 lb

## 2014-06-19 DIAGNOSIS — S59901D Unspecified injury of right elbow, subsequent encounter: Secondary | ICD-10-CM | POA: Diagnosis not present

## 2014-06-19 NOTE — Progress Notes (Addendum)
   06/19/2014 at 1:56 PM  Andrew [name] / DOB: 10/17/1980 / MRN: [id number]   SUBJECTIVE  Chief complaint: Follow-up   History of present illness: Andrew [name] is 34 y.o. well appearing male presenting for a follow up of right elbow pain. [facility] that occurred at [facility].  The HPI composed by Wallis BambergMario Mani PA-C is as follows: Elbow injury - patient reports 5 day history of right elbow injury sustained while at work. Patient works in [employer], bends vehicle bumpers, was pulling down on a lever with both hands, slammed right elbow down onto metal. Patient was seen immediately by a nurse at work, helped ice elbow, took ibuprofen with some relief. Today he reports his pain is mild, rated 2/10, constant, worse with touch, non-radiating associated with swelling, mild erythema. Denies fevers, decreased ROM, decreased sensation or strength, numbness or tingling. Of note, patient has tried to continue with his normal routine including exercises such as push-ups. He admits he has not needed any ibuprofen for pain but would like to make sure he does not have a fracture or dislocation."  Today he reports his pain is roughly 25% better.  He has been taking his medication as prescribed and has been wearing the sling at work.  He continues to work "light duty" using his left hand only and would like to continue in this fashion. He does complain of bumping the elbow on objects which increases his pain.     ROS  Per HPI  OBJECTIVE  His  height is 5\' 11"  (1.803 m) and weight is 181 lb 3.2 oz (82.192 kg). His oral temperature is 97.9 F (36.6 C). His blood pressure is 122/76 and his pulse is 66. His respiration is 16 and oxygen saturation is 100%.  The patient's body mass index is 25.28 kg/(m^2).  Physical Exam  Constitutional: He is oriented to person, place, and time. He appears well-developed and well-nourished.  Musculoskeletal: Normal range of motion.       Right  elbow: He exhibits normal range of motion, no swelling and no deformity. Tenderness found. Olecranon process tenderness noted. No radial head, no medial epicondyle and no lateral epicondyle tenderness noted.  Neurological: He is alert and oriented to person, place, and time.  Skin: Skin is warm and dry.  Psychiatric: He has a normal mood and affect.    No results found for this or any previous visit (from the past 24 hour(s)).  ASSESSMENT & PLAN  Andrew FearingJames was seen today for follow-up.  Diagnoses and all orders for this visit:  Elbow injury, right, subsequent encounter: Improving. Compressive cotton and ace bandage wrapped around the elbow for protection.  Advised he continue to wear the sling while at work.  Note provided via communications tab maintaining his light duty status until 06/26/14 at which time he will RTC for reeval.  He is to continue Mobic 7.5 mg daily. Patient voiced understanding.     The patient was advised to call or come back to clinic if he does not see an improvement in symptoms, or worsens with the above plan.   Deliah BostonMichael Clark, MHS, PA-C [facility] 06/19/2014 1:56 PM

## 2014-06-26 ENCOUNTER — Ambulatory Visit (INDEPENDENT_AMBULATORY_CARE_PROVIDER_SITE_OTHER): Payer: BLUE CROSS/BLUE SHIELD | Admitting: Physician Assistant

## 2014-06-26 VITALS — BP 132/82 | HR 72 | Temp 98.1°F | Resp 18 | Ht 70.0 in | Wt 181.0 lb

## 2014-06-26 DIAGNOSIS — M545 Low back pain, unspecified: Secondary | ICD-10-CM

## 2014-06-26 DIAGNOSIS — M25521 Pain in right elbow: Secondary | ICD-10-CM

## 2014-06-26 DIAGNOSIS — S59901D Unspecified injury of right elbow, subsequent encounter: Secondary | ICD-10-CM | POA: Diagnosis not present

## 2014-06-26 MED ORDER — MELOXICAM 15 MG PO TABS: 15.0000 mg | ORAL_TABLET | Freq: Every day | ORAL | Status: DC

## 2014-06-26 MED ORDER — ACETAMINOPHEN 500 MG PO TABS: 500.0000 mg | ORAL_TABLET | Freq: Four times a day (QID) | ORAL | Status: DC | PRN

## 2014-06-26 NOTE — Progress Notes (Signed)
06/26/2014 at 1:00 PM  Andrew DixonJames R [name] / DOB: 06/15/1980 / MRN: [id number]   SUBJECTIVE  Chief complaint: Follow-up   History of present illness: Andrew [name] is 34 y.o. well appearing male presenting for a follow up of right elbow pain. [facility] that occurred at [facility].  The HPI composed by Wallis BambergMario Mani PA-C is as follows: Elbow injury - patient reports 5 day history of right elbow injury sustained while at work. Patient works in [employer] work[employer], bends vehicle bumpers, was pulling down on a lever with both hands, slammed right elbow down onto metal. Patient was seen immediately by a nurse at work, helped ice elbow, took ibuprofen with some relief. Today he reports his pain is mild, rated 2/10, constant, worse with touch, non-radiating associated with swelling, mild erythema. Denies fevers, decreased ROM, decreased sensation or strength, numbness or tingling. Of note, patient has tried to continue with his normal routine including exercises such as push-ups. He admits he has not needed any ibuprofen for pain but would like to make sure he does not have a fracture or dislocation."  Today he reports he is roughly 50-60% percent better from the original injury. He continues to have pain over the olecranon process and generalized pain of the proximal forearm, and says this pain worsens as the day goes on.  He would like to stop wearing the sling as he feels that this is making his pain worse.  He denies fever and redness at the sight of the injury.      ROS  Per HPI  OBJECTIVE  His  height is 5\' 10"  (1.778 m) and weight is 181 lb (82.101 kg). His oral temperature is 98.1 F (36.7 C). His blood pressure is 132/82 and his pulse is 72. His respiration is 18 and oxygen saturation is 97%.  The patient's body mass index is 25.97 kg/(m^2).  Physical Exam  Constitutional: He is oriented to person, place, and time. He appears well-developed and well-nourished.    Musculoskeletal: Normal range of motion.       Right elbow: He exhibits normal range of motion, no swelling and no deformity. Tenderness found. Olecranon process tenderness noted. No radial head, no medial epicondyle and no lateral epicondyle tenderness noted.  Neurological: He is alert and oriented to person, place, and time.  Skin: Skin is warm and dry.  Psychiatric: He has a normal mood and affect.    No results found for this or any previous visit (from the past 24 hour(s)).  ASSESSMENT & PLAN  Andrew [name] was seen today for follow-up.  Diagnoses and all orders for this visit:  Elbow injury, right, subsequent encounter: Improving. Compressive cotton and ace bandage wrapped around the elbow for protection.  Advised he discontinue use of the sling to prevent deconditioning.  Note provided via communications tab maintaining his light duty status until 07/03/14 at which time he will RTC for reeval.  He is to start Mobic 15 mg daily and stop ibuprofen.   Advised that if he is not 90-95% better by the next follow up then it may be reasonable to refer to orthopedics. Patient voiced understanding.     The patient was advised to call or come back to clinic if he does not see an improvement in symptoms, or worsens with the above plan.   Deliah BostonMichael Tahji Tibes, MHS, PA-C [facility] 06/26/2014 1:00 PM

## 2014-07-03 ENCOUNTER — Ambulatory Visit (INDEPENDENT_AMBULATORY_CARE_PROVIDER_SITE_OTHER): Payer: BLUE CROSS/BLUE SHIELD | Admitting: Physician Assistant

## 2014-07-03 VITALS — BP 126/78 | HR 84 | Temp 98.1°F | Resp 16 | Ht 70.0 in | Wt 183.0 lb

## 2014-07-03 DIAGNOSIS — S59901D Unspecified injury of right elbow, subsequent encounter: Secondary | ICD-10-CM

## 2014-07-03 NOTE — Progress Notes (Signed)
   07/03/2014 at 4:10 PM  Andrew [name] / DOB: 04/07/1980 / MRN: [id number]   SUBJECTIVE  Chief complaint: Follow-up   History of present illness: Andrew [name] is 34 y.o. well appearing male presenting for a follow up of right elbow pain. [facility] that occurred at [facility].  The HPI composed by Wallis BambergMario Mani PA-C is as follows: Elbow injury - patient reports 5 day history of right elbow injury sustained while at work. Patient works in [employer] work[employer], bends vehicle bumpers, was pulling down on a lever with both hands, slammed right elbow down onto metal. Patient was seen immediately by a nurse at work, helped ice elbow, took ibuprofen with some relief. Today he reports his pain is mild, rated 2/10, constant, worse with touch, non-radiating associated with swelling, mild erythema. Denies fevers, decreased ROM, decreased sensation or strength, numbness or tingling. Of note, patient has tried to continue with his normal routine including exercises such as push-ups. He admits he has not needed any ibuprofen for pain but would like to make sure he does not have a fracture or dislocation."  Today he reports he is roughly 85% percent better from the original injury and he denies tenderness of the olecrannon process and proximal forearm. He feels like he is ready to return to full duty and would like to start doing pushups again.       ROS  Per HPI  OBJECTIVE  His  height is 5\' 10"  (1.778 m) and weight is 183 lb (83.008 kg). His oral temperature is 98.1 F (36.7 C). His blood pressure is 126/78 and his pulse is 84. His respiration is 16 and oxygen saturation is 97%.  The patient's body mass index is 26.26 kg/(m^2).  Physical Exam  Constitutional: He is oriented to person, place, and time. He appears well-developed and well-nourished.  Musculoskeletal: Normal range of motion.       Right elbow: He exhibits normal range of motion, no swelling and no deformity. Tenderness  found. Olecranon process tenderness noted. No radial head, no medial epicondyle and no lateral epicondyle tenderness noted.  Neurological: He is alert and oriented to person, place, and time.  Skin: Skin is warm and dry.  Psychiatric: He has a normal mood and affect.    No results found for this or any previous visit (from the past 24 hour(s)).  ASSESSMENT & PLAN  Andrew [name] was seen today for follow-up.  Diagnoses and all orders for this visit:  Elbow injury, right, subsequent encounter: Improving. Compressive cotton and ace bandage wrapped around the elbow for protection. Advised he continue to use Meloxicam or Ibuprofen for pain, but not both. He is to follow up as needed for this problem.    The patient was advised to call or come back to clinic if he does not see an improvement in symptoms, or worsens with the above plan.   Deliah BostonMichael Kalese Ensz, MHS, PA-C [facility] 07/03/2014 4:10 PM

## 2014-11-29 ENCOUNTER — Encounter (HOSPITAL_COMMUNITY)
Admission: EM | Disposition: A | Discharge status: Home / Self Care | Payer: Self-pay | Source: Home / Self Care | Attending: Emergency Medicine

## 2014-11-29 ENCOUNTER — Encounter (HOSPITAL_COMMUNITY): Payer: Self-pay | Admitting: Emergency Medicine

## 2014-11-29 ENCOUNTER — Emergency Department (HOSPITAL_COMMUNITY): Payer: BLUE CROSS/BLUE SHIELD

## 2014-11-29 ENCOUNTER — Ambulatory Visit (HOSPITAL_COMMUNITY)
Admission: EM | Admit: 2014-11-29 | Discharge: 2014-11-30 | Disposition: A | Discharge status: Home / Self Care | Payer: BLUE CROSS/BLUE SHIELD | Attending: Emergency Medicine | Admitting: Emergency Medicine

## 2014-11-29 DIAGNOSIS — S61512A Laceration without foreign body of left wrist, initial encounter: Secondary | ICD-10-CM | POA: Diagnosis not present

## 2014-11-29 DIAGNOSIS — Y9389 Activity, other specified: Secondary | ICD-10-CM | POA: Insufficient documentation

## 2014-11-29 DIAGNOSIS — S6402XA Injury of ulnar nerve at wrist and hand level of left arm, initial encounter: Secondary | ICD-10-CM | POA: Insufficient documentation

## 2014-11-29 DIAGNOSIS — S65012A Laceration of ulnar artery at wrist and hand level of left arm, initial encounter: Secondary | ICD-10-CM | POA: Insufficient documentation

## 2014-11-29 DIAGNOSIS — Y998 Other external cause status: Secondary | ICD-10-CM | POA: Insufficient documentation

## 2014-11-29 DIAGNOSIS — Z87891 Personal history of nicotine dependence: Secondary | ICD-10-CM | POA: Diagnosis not present

## 2014-11-29 DIAGNOSIS — Y92414 Local residential or business street as the place of occurrence of the external cause: Secondary | ICD-10-CM | POA: Diagnosis not present

## 2014-11-29 DIAGNOSIS — S56222A Laceration of other flexor muscle, fascia and tendon at forearm level, left arm, initial encounter: Secondary | ICD-10-CM | POA: Insufficient documentation

## 2014-11-29 DIAGNOSIS — Z91030 Bee allergy status: Secondary | ICD-10-CM | POA: Insufficient documentation

## 2014-11-29 DIAGNOSIS — S55002A Unspecified injury of ulnar artery at forearm level, left arm, initial encounter: Secondary | ICD-10-CM

## 2014-11-29 DIAGNOSIS — G8918 Other acute postprocedural pain: Secondary | ICD-10-CM | POA: Diagnosis not present

## 2014-11-29 HISTORY — PX: NERVE, TENDON AND ARTERY REPAIR: SHX5695

## 2014-11-29 LAB — CBC WITH DIFFERENTIAL/PLATELET
Basophils Absolute: 0 10*3/uL (ref 0.0–0.1)
Basophils Relative: 0 % (ref 0–1)
Eosinophils Absolute: 0.1 10*3/uL (ref 0.0–0.7)
Eosinophils Relative: 2 % (ref 0–5)
HEMATOCRIT: 44.4 % (ref 39.0–52.0)
Hemoglobin: 14.3 g/dL (ref 13.0–17.0)
LYMPHS PCT: 36 % (ref 12–46)
Lymphs Abs: 2.4 10*3/uL (ref 0.7–4.0)
MCH: 28.8 pg (ref 26.0–34.0)
MCHC: 32.2 g/dL (ref 30.0–36.0)
MCV: 89.5 fL (ref 78.0–100.0)
MONO ABS: 0.7 10*3/uL (ref 0.1–1.0)
MONOS PCT: 11 % (ref 3–12)
NEUTROS ABS: 3.5 10*3/uL (ref 1.7–7.7)
Neutrophils Relative %: 51 % (ref 43–77)
Platelets: 176 10*3/uL (ref 150–400)
RBC: 4.96 MIL/uL (ref 4.22–5.81)
RDW: 14.7 % (ref 11.5–15.5)
WBC: 6.8 10*3/uL (ref 4.0–10.5)

## 2014-11-29 LAB — TYPE AND SCREEN
ABO/RH(D): O POS
Antibody Screen: NEGATIVE

## 2014-11-29 LAB — BASIC METABOLIC PANEL
Anion gap: 14 (ref 5–15)
BUN: 16 mg/dL (ref 6–20)
CALCIUM: 9.6 mg/dL (ref 8.9–10.3)
CO2: 24 mmol/L (ref 22–32)
CREATININE: 1.24 mg/dL (ref 0.61–1.24)
Chloride: 101 mmol/L (ref 101–111)
GFR calc Af Amer: >60 mL/min (ref >60)
GFR calc non Af Amer: >60 mL/min (ref >60)
GLUCOSE: 132 mg/dL — AB (ref 65–99)
Potassium: 2.9 mmol/L — ABNORMAL LOW (ref 3.5–5.1)
Sodium: 139 mmol/L (ref 135–145)

## 2014-11-29 LAB — PROTIME-INR
INR: 1.14 (ref 0.00–1.49)
Prothrombin Time: 14.7 seconds (ref 11.6–15.2)

## 2014-11-29 LAB — APTT: aPTT: 28 seconds (ref 24–37)

## 2014-11-29 LAB — ABO/RH: ABO/RH(D): O POS

## 2014-11-29 SURGERY — NERVE, TENDON AND ARTERY REPAIR
Anesthesia: General | Site: Wrist | Laterality: Left

## 2014-11-29 MED ORDER — FENTANYL CITRATE (PF) 100 MCG/2ML IJ SOLN: 100.0000 ug | Freq: Once | INTRAMUSCULAR | Status: AC

## 2014-11-29 MED ORDER — HYDROMORPHONE HCL 1 MG/ML IJ SOLN: 1.0000 mg | Freq: Once | INTRAMUSCULAR | Status: DC

## 2014-11-29 MED ORDER — HYDROMORPHONE HCL 1 MG/ML IJ SOLN
1.0000 mg | Freq: Once | INTRAMUSCULAR | Status: AC
  Administered 2014-11-29: 1 mg via INTRAVENOUS
  Filled 2014-11-29: qty 1

## 2014-11-29 MED ORDER — FENTANYL CITRATE (PF) 100 MCG/2ML IJ SOLN
100.0000 ug | Freq: Once | INTRAMUSCULAR | Status: AC
  Administered 2014-11-29: 100 ug via INTRAVENOUS
  Filled 2014-11-29: qty 2

## 2014-11-29 SURGICAL SUPPLY — 60 items
BANDAGE ELASTIC 4 VELCRO ST LF (GAUZE/BANDAGES/DRESSINGS) ×2 IMPLANT
BNDG CMPR 9X4 STRL LF SNTH (GAUZE/BANDAGES/DRESSINGS) ×1
BNDG ESMARK 4X9 LF (GAUZE/BANDAGES/DRESSINGS) ×3 IMPLANT
BNDG GAUZE ELAST 4 BULKY (GAUZE/BANDAGES/DRESSINGS) ×4 IMPLANT
CLOSURE WOUND 1/2 X4 (GAUZE/BANDAGES/DRESSINGS)
CORDS BIPOLAR (ELECTRODE) ×3 IMPLANT
COVER SURGICAL LIGHT HANDLE (MISCELLANEOUS) ×3 IMPLANT
CUFF TOURNIQUET SINGLE 18IN (TOURNIQUET CUFF) ×2 IMPLANT
CUFF TOURNIQUET SINGLE 24IN (TOURNIQUET CUFF) IMPLANT
DECANTER SPIKE VIAL GLASS SM (MISCELLANEOUS) IMPLANT
DRAPE OEC MINIVIEW 54X84 (DRAPES) IMPLANT
DRAPE SURG 17X23 STRL (DRAPES) ×3 IMPLANT
DURAPREP 26ML APPLICATOR (WOUND CARE) ×1 IMPLANT
GAUZE SPONGE 4X4 12PLY STRL (GAUZE/BANDAGES/DRESSINGS) ×2 IMPLANT
GAUZE XEROFORM 1X8 LF (GAUZE/BANDAGES/DRESSINGS) IMPLANT
GAUZE XEROFORM 5X9 LF (GAUZE/BANDAGES/DRESSINGS) ×2 IMPLANT
GLOVE SURG SYN 8.0 (GLOVE) ×9 IMPLANT
GLOVE SURG SYN 8.0 PF PI (GLOVE) ×1 IMPLANT
GOWN STRL REUS W/ TWL LRG LVL3 (GOWN DISPOSABLE) ×1 IMPLANT
GOWN STRL REUS W/ TWL XL LVL3 (GOWN DISPOSABLE) ×1 IMPLANT
GOWN STRL REUS W/TWL LRG LVL3 (GOWN DISPOSABLE) ×3
GOWN STRL REUS W/TWL XL LVL3 (GOWN DISPOSABLE) ×3
GUIDE NERVE NEUROGEN 7X3 CM (Tissue) IMPLANT
KIT BASIN OR (CUSTOM PROCEDURE TRAY) ×3 IMPLANT
KIT ROOM TURNOVER OR (KITS) ×3 IMPLANT
LOOP VESSEL MAXI BLUE (MISCELLANEOUS) IMPLANT
MANIFOLD NEPTUNE II (INSTRUMENTS) ×3 IMPLANT
NDL HYPO 25GX1X1/2 BEV (NEEDLE) IMPLANT
NEEDLE HYPO 25GX1X1/2 BEV (NEEDLE) IMPLANT
NERVE GUIDE NEUROGEN 7X3 CM (Tissue) ×3 IMPLANT
NS IRRIG 1000ML POUR BTL (IV SOLUTION) ×3 IMPLANT
PACK ORTHO EXTREMITY (CUSTOM PROCEDURE TRAY) ×3 IMPLANT
PAD ARMBOARD 7.5X6 YLW CONV (MISCELLANEOUS) ×6 IMPLANT
PAD CAST 4YDX4 CTTN HI CHSV (CAST SUPPLIES) IMPLANT
PADDING CAST COTTON 4X4 STRL (CAST SUPPLIES)
SPEAR EYE SURG WECK-CEL (MISCELLANEOUS) ×4 IMPLANT
SPECIMEN JAR SMALL (MISCELLANEOUS) ×1 IMPLANT
SPLINT PLASTER CAST XFAST 4X15 (CAST SUPPLIES) IMPLANT
SPLINT PLASTER XTRA FAST SET 4 (CAST SUPPLIES) ×2
STRIP CLOSURE SKIN 1/2X4 (GAUZE/BANDAGES/DRESSINGS) IMPLANT
SUCTION FRAZIER TIP 10 FR DISP (SUCTIONS) IMPLANT
SUT ETHIBOND 3-0 V-5 (SUTURE) IMPLANT
SUT ETHILON 4 0 PS 2 18 (SUTURE) ×4 IMPLANT
SUT ETHILON 5 0 PS 2 18 (SUTURE) IMPLANT
SUT ETHILON 7 0 P 1 (SUTURE) ×2 IMPLANT
SUT FIBERWIRE 2-0 18 17.9 3/8 (SUTURE) ×3
SUT MERSILENE 4 0 P 3 (SUTURE) IMPLANT
SUT NYLON 9 0 VRM6 (SUTURE) ×4 IMPLANT
SUT PROLENE 3 0 PS 2 (SUTURE) IMPLANT
SUT SILK 4 0 PS 2 (SUTURE) IMPLANT
SUT VIC AB 3-0 FS2 27 (SUTURE) IMPLANT
SUT VICRYL RAPIDE 4/0 PS 2 (SUTURE) IMPLANT
SUTURE FIBERWR 2-0 18 17.9 3/8 (SUTURE) IMPLANT
SYR CONTROL 10ML LL (SYRINGE) IMPLANT
TOWEL OR 17X24 6PK STRL BLUE (TOWEL DISPOSABLE) ×3 IMPLANT
TOWEL OR 17X26 10 PK STRL BLUE (TOWEL DISPOSABLE) ×3 IMPLANT
TUBE CONNECTING 12'X1/4 (SUCTIONS)
TUBE CONNECTING 12X1/4 (SUCTIONS) IMPLANT
UNDERPAD 30X30 INCONTINENT (UNDERPADS AND DIAPERS) ×3 IMPLANT
WATER STERILE IRR 1000ML POUR (IV SOLUTION) ×1 IMPLANT

## 2014-11-29 NOTE — ED Notes (Signed)
[employer] remains at bedside. 

## 2014-11-29 NOTE — Anesthesia Preprocedure Evaluation (Addendum)
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 per patient:   Pulmonary Current Smoker, former smoker,  1 ppd   breath sounds clear to auscultation       Cardiovascular negative cardio ROS   Rhythm:regular Rate:Normal     Neuro/Psych    GI/Hepatic   Endo/Other    Renal/GU      Musculoskeletal   Abdominal   Peds  Hematology   Anesthesia Other Findings   Reproductive/Obstetrics                          Anesthesia Physical Anesthesia Plan  ASA: II and emergent  Anesthesia Plan: General   Post-op Pain Management:    Induction: Intravenous, Rapid sequence and Cricoid pressure planned  Airway Management Planned: Oral ETT  Additional Equipment:   Intra-op Plan:   Post-operative Plan: Extubation in OR  Informed Consent: I have reviewed the patients History and Physical, chart, labs and discussed the procedure including the risks, benefits and alternatives for the proposed anesthesia with the patient or authorized representative who has indicated his/her understanding and acceptance.   Dental advisory given  Plan Discussed with: Anesthesiologist, Surgeon and CRNA  Anesthesia Plan Comments:        Anesthesia Quick Evaluation

## 2014-11-29 NOTE — ED Notes (Signed)
Report given to Rosey Bath, CRNA in the OR at [facility]
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
## 2014-11-30 ENCOUNTER — Emergency Department (HOSPITAL_COMMUNITY): Payer: BLUE CROSS/BLUE SHIELD | Admitting: Anesthesiology

## 2014-11-30 ENCOUNTER — Encounter (HOSPITAL_COMMUNITY): Payer: Self-pay | Admitting: Emergency Medicine

## 2014-11-30 ENCOUNTER — Emergency Department (HOSPITAL_COMMUNITY)
Admission: EM | Admit: 2014-11-30 | Discharge: 2014-11-30 | Disposition: A | Discharge status: Home / Self Care | Payer: BLUE CROSS/BLUE SHIELD | Attending: Emergency Medicine | Admitting: Emergency Medicine

## 2014-11-30 DIAGNOSIS — G8918 Other acute postprocedural pain: Secondary | ICD-10-CM | POA: Diagnosis not present

## 2014-11-30 DIAGNOSIS — Z8781 Personal history of (healed) traumatic fracture: Secondary | ICD-10-CM | POA: Diagnosis not present

## 2014-11-30 DIAGNOSIS — Z87891 Personal history of nicotine dependence: Secondary | ICD-10-CM | POA: Insufficient documentation

## 2014-11-30 DIAGNOSIS — M79642 Pain in left hand: Secondary | ICD-10-CM | POA: Diagnosis present

## 2014-11-30 MED ORDER — ONDANSETRON HCL 4 MG/2ML IJ SOLN
INTRAMUSCULAR | Status: DC | PRN
  Administered 2014-11-30: 4 mg via INTRAVENOUS

## 2014-11-30 MED ORDER — HYDROMORPHONE HCL 1 MG/ML IJ SOLN: 0.2500 mg | INTRAMUSCULAR | Status: DC | PRN

## 2014-11-30 MED ORDER — PROPOFOL 10 MG/ML IV BOLUS
INTRAVENOUS | Status: DC | PRN
  Administered 2014-11-30: 70 mg via INTRAVENOUS
  Administered 2014-11-30: 180 mg via INTRAVENOUS

## 2014-11-30 MED ORDER — OXYCODONE HCL 5 MG PO TABS
ORAL_TABLET | ORAL | Status: AC
  Filled 2014-11-30: qty 1

## 2014-11-30 MED ORDER — CEFAZOLIN SODIUM-DEXTROSE 2-3 GM-% IV SOLR
INTRAVENOUS | Status: DC | PRN
  Administered 2014-11-30: 2 g via INTRAVENOUS

## 2014-11-30 MED ORDER — OXYCODONE-ACETAMINOPHEN 5-325 MG PO TABS: 1.0000 | ORAL_TABLET | ORAL | Status: DC | PRN

## 2014-11-30 MED ORDER — ONDANSETRON 4 MG PO TBDP: 8.0000 mg | ORAL_TABLET | Freq: Once | ORAL | Status: DC

## 2014-11-30 MED ORDER — SUCCINYLCHOLINE CHLORIDE 20 MG/ML IJ SOLN
INTRAMUSCULAR | Status: DC | PRN
  Administered 2014-11-30: 120 mg via INTRAVENOUS

## 2014-11-30 MED ORDER — SODIUM CHLORIDE 0.9 % IR SOLN
Status: DC | PRN
  Administered 2014-11-30: 1000 mL

## 2014-11-30 MED ORDER — LIDOCAINE HCL (CARDIAC) 20 MG/ML IV SOLN
INTRAVENOUS | Status: DC | PRN
  Administered 2014-11-30: 60 mg via INTRAVENOUS

## 2014-11-30 MED ORDER — ONDANSETRON HCL 4 MG/2ML IJ SOLN: 4.0000 mg | Freq: Four times a day (QID) | INTRAMUSCULAR | Status: DC | PRN

## 2014-11-30 MED ORDER — FENTANYL CITRATE (PF) 250 MCG/5ML IJ SOLN
INTRAMUSCULAR | Status: DC | PRN
  Administered 2014-11-30 (×2): 100 ug via INTRAVENOUS
  Administered 2014-11-30: 50 ug via INTRAVENOUS
  Administered 2014-11-30: 100 ug via INTRAVENOUS
  Administered 2014-11-30: 50 ug via INTRAVENOUS
  Administered 2014-11-30: 100 ug via INTRAVENOUS

## 2014-11-30 MED ORDER — OXYCODONE HCL 5 MG PO TABS
5.0000 mg | ORAL_TABLET | Freq: Once | ORAL | Status: AC | PRN
  Administered 2014-11-30: 5 mg via ORAL

## 2014-11-30 MED ORDER — LACTATED RINGERS IV SOLN
INTRAVENOUS | Status: DC | PRN
  Administered 2014-11-29 – 2014-11-30 (×2): via INTRAVENOUS

## 2014-11-30 MED ORDER — OXYCODONE-ACETAMINOPHEN 5-325 MG PO TABS
ORAL_TABLET | ORAL | Status: AC
  Filled 2014-11-30: qty 1

## 2014-11-30 MED ORDER — OXYCODONE HCL 5 MG/5ML PO SOLN: 5.0000 mg | Freq: Once | ORAL | Status: AC | PRN

## 2014-11-30 MED ORDER — HYDROMORPHONE HCL 1 MG/ML IJ SOLN: 2.0000 mg | Freq: Once | INTRAMUSCULAR | Status: DC

## 2014-11-30 MED ORDER — MIDAZOLAM HCL 2 MG/2ML IJ SOLN
INTRAMUSCULAR | Status: DC | PRN
  Administered 2014-11-30: 2 mg via INTRAVENOUS

## 2014-11-30 NOTE — Op Note (Signed)
See note [id number]

## 2014-11-30 NOTE — Anesthesia Procedure Notes (Signed)
Procedure Name: Intubation Date/Time: 11/30/2014 12:08 AM Performed by: Molli Hazard Pre-anesthesia Checklist: Patient identified, Emergency Drugs available, Suction available and Patient being monitored Patient Re-evaluated:Patient Re-evaluated prior to inductionOxygen Delivery Method: Circle system utilized Preoxygenation: Pre-oxygenation with 100% oxygen Intubation Type: IV induction, Rapid sequence and Cricoid Pressure applied Laryngoscope Size: Miller and 2 Grade View: Grade I Tube type: Oral Tube size: 7.5 mm Number of attempts: 1 Airway Equipment and Method: Stylet Placement Confirmation: ETT inserted through vocal cords under direct vision,  positive ETCO2 and breath sounds checked- equal and bilateral Secured at: 24 cm Tube secured with: Tape Dental Injury: Teeth and Oropharynx as per pre-operative assessment

## 2014-11-30 NOTE — ED Provider Notes (Signed)
CSN: [id number]     Arrival date & time 11/30/14  3086 History   First MD Initiated Contact with Patient 11/30/14 [phone]     Chief Complaint  Patient presents with  . Hand Pain     (Consider location/radiation/quality/duration/timing/severity/associated sxs/prior Treatment) Patient is a 34 y.o. male presenting with hand pain. The history is provided by the patient.  Hand Pain  He had surgery on his left wrist for nerve artery tendon injury. He went home and took a dose of the painkiller that he was prescribed but he has not had any relief of pain with that. He is complaining of severe pain in his hand and wonders if his dressing is on too tight. Rates pain at 10/10. He denies numbness or tingling.  Past Medical History  Diagnosis Date  . Broken finger 2001  . Staph skin infection 2001    need surgical debridment and IV antibiotics   . Allergy     bee sting   History reviewed. No pertinent past surgical history. Family History  Problem Relation Age of Onset  . Hypertension Mother   . Diabetes Mother    Social History  Substance Use Topics  . Smoking status: Former Smoker -- 0.50 packs/day  . Smokeless tobacco: Never Used  . Alcohol Use: No    Review of Systems  All other systems reviewed and are negative.     Allergies  Bee venom  Home Medications   Prior to Admission medications   Medication Sig Start Date End Date Taking? Authorizing Provider  oxyCODONE-acetaminophen (ROXICET) 5-325 MG per tablet Take 1 tablet by mouth every 4 (four) hours as needed for severe pain. 11/30/14   Dairl Ponder, MD   BP 137/78 mmHg  Pulse 54  Temp(Src) 98.4 F (36.9 C) (Oral)  Resp 18  SpO2 97% Physical Exam  Nursing note and vitals reviewed.  34 year old male, resting comfortably and in no acute distress. Vital signs are normal. Oxygen saturation is 97%, which is normal. Head is normocephalic and atraumatic. PERRLA, EOMI. Oropharynx is clear. Neck is nontender and supple  without adenopathy or JVD. Back is nontender and there is no CVA tenderness. Lungs are clear without rales, wheezes, or rhonchi. Chest is nontender. Heart has regular rate and rhythm without murmur. Abdomen is soft, flat, nontender without masses or hepatosplenomegaly and peristalsis is normoactive. Extremities: Left arm is in a bulky dressing) appears to have a volar splint. Dressing is not removed but it does not appear to be too tight. Capillary refill is prompt and he has normal sensation throughout his hand area no evidence of nerve injury or vascular compromise. Skin is warm and dry without rash. Neurologic: Mental status is normal, cranial nerves are intact, there are no motor or sensory deficits.  ED Course  Procedures (including critical care time)   MDM   Final diagnoses:  Postoperative pain    Postoperative pain. He'll be given injection of hydromorphone and reassessed. Old records reviewed confirming that he had presented to the ED with a laceration to his left wrist and was taken to the operating room for repair of median nerve, ulnar nerve, radial artery, and tendon injury.  Patient is pain-free and sleeping soundly. He is discharged to continue routine postoperative care.  Dione Booze, MD 11/30/14 [phone]
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
## 2014-11-30 NOTE — Anesthesia Postprocedure Evaluation (Signed)
Anesthesia Post Note  Patient: Andrew [name]  Procedure(s) Performed: Procedure(s) (LRB):  REPAIR OF MEDIAN AND ULNA NERVE, REPAIR OF ULNA  ARTERY, REPAIR OF FLEXOR CARPAL ULNA TENDON  (Left)  Anesthesia type: General  Patient location: PACU  Post pain: Pain level controlled and Adequate analgesia  Post assessment: Post-op Vital signs reviewed, Patient's Cardiovascular Status Stable, Respiratory Function Stable, Patent Airway and Pain level controlled  Last Vitals:  Filed Vitals:   11/30/14 0344  BP: 154/94  Pulse:   Temp:   Resp:     Post vital signs: Reviewed and stable  Level of consciousness: awake, alert  and oriented  Complications: No apparent anesthesia complications

## 2014-11-30 NOTE — ED Notes (Signed)
Pt sleeping, snoring respirations, arouses to stimuli. States no pain.

## 2014-11-30 NOTE — ED Notes (Signed)
Pt states he still can not feel his fingers but RN SS can place fingers between cast and arm. NAD.

## 2014-11-30 NOTE — ED Notes (Signed)
EDP and ortho tech at bedside, cast assessed and noted to be non-restricting.

## 2014-11-30 NOTE — Discharge Instructions (Signed)
Continue your postoperative instructions.

## 2014-11-30 NOTE — Transfer of Care (Signed)
Immediate Anesthesia Transfer of Care Note  Patient: Andrew [name]  Procedure(s) Performed: Procedure(s):  REPAIR OF MEDIAN AND ULNA NERVE, REPAIR OF ULNA  ARTERY, REPAIR OF FLEXOR CARPAL ULNA TENDON  (Left)  Patient Location: PACU  Anesthesia Type:General  Level of Consciousness: sedated and patient cooperative  Airway & Oxygen Therapy: Patient connected to nasal cannula oxygen  Post-op Assessment: Report given to RN and Post -op Vital signs reviewed and stable  Post vital signs: Reviewed and stable  Last Vitals:  Filed Vitals:   11/29/14 2220  BP: 162/113  Pulse: 104  Resp: 35    Complications: No apparent anesthesia complications

## 2014-11-30 NOTE — Op Note (Signed)
[name], [name] NO.:  [id number]  MEDICAL RECORD NO.:  [id number]  LOCATION:  B14C                         FACILITY:  [facility]  PHYSICIAN:  Artist Pais. Zaylynn Rickett, M.D.DATE OF BIRTH:  03/07/1981  DATE OF PROCEDURE:  11/30/2014 DATE OF DISCHARGE:  11/30/2014                              OPERATIVE REPORT   PREOPERATIVE DIAGNOSIS:  Deep laceration, volar ulnar side, left wrist.  POSTOPERATIVE DIAGNOSIS:  Deep laceration, volar ulnar side, left wrist.  PROCEDURES:  Exploration of above with primary repair of ulnar artery and ulnar nerve microscopically, median nerve with 7 mm nerve conduit microscopically, and flexor carpi ulnaris tendon.  SURGEON:  Artist Pais. Mina Marble, M.D.  ASSISTANT:  None.  ANESTHESIA:  General.  COMPLICATIONS:  None.  DRAINS:  None.  DESCRIPTION OF PROCEDURE:  The patient was taken to the operating suite. After the induction of adequate general anesthetic, left upper extremity was prepped and draped in usual sterile fashion.  Esmarch was used to exsanguinate the limb.  Tourniquet was inflated to 250 mmHg.  At this point in time, a laceration along the ulnar volar area of the wrist, was expanded proximally in midline, distally across the palm.  Flaps were raised.  Dissection was carried down to the ulnar neurovascular bundle. There was complete laceration of flexor carpi ulnaris tendon and of the ulnar neurovascular bundle.  We then dissected into the area of the carpal canal.  There was complete laceration of the median nerve just proximal to the proximal edge of the transverse carpal ligament.  We had to extend our incision to release the carpal canal.  We then identified the distal aspect of the lacerated median nerve.  The flexor tendons and the canal were all intact.  We thoroughly irrigated the wound.  Under microscopic magnification, we repaired the median nerve with a 7 mm nerve conduit and we then repaired the ulnar artery and  ulnar nerve microscopically using 9-0 nylon.  We then removed the microscope from the field and repaired the flexor carpi ulnaris tendon with 2-0 FiberWire x4 horizontal mattress sutures.  At the end of procedure, we irrigated and loosely closed with 4-0 nylon, Xeroform, 4x4s, fluffs, and dorsal extension block splint was applied.  The patient tolerated all procedures well, went to the recovery room in stable fashion.     Artist Pais Mina Marble, M.D.     MAW/MEDQ  D:  11/30/2014  T:  11/30/2014  Job:  [id number]

## 2014-12-05 ENCOUNTER — Ambulatory Visit: Payer: BLUE CROSS/BLUE SHIELD | Admitting: Occupational Therapy

## 2014-12-05 ENCOUNTER — Ambulatory Visit: Payer: BLUE CROSS/BLUE SHIELD | Attending: Orthopedic Surgery | Admitting: Occupational Therapy

## 2014-12-05 DIAGNOSIS — M25632 Stiffness of left wrist, not elsewhere classified: Secondary | ICD-10-CM | POA: Diagnosis present

## 2014-12-05 DIAGNOSIS — R29898 Other symptoms and signs involving the musculoskeletal system: Secondary | ICD-10-CM | POA: Insufficient documentation

## 2014-12-05 DIAGNOSIS — M25532 Pain in left wrist: Secondary | ICD-10-CM | POA: Insufficient documentation
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
## 2014-12-05 NOTE — Patient Instructions (Addendum)
WEARING SCHEDULE:  Wear splint at ALL times except for hygiene care  Remove splint 1x day to wash around incisions, and change dressing. Apply xeroform to open wounds only. Once wounds begin to dry out/ close stop applying xeroform, and just apply a dry gauze bandage, and top with stockinette.. While cleaning and changing dressing do not bend or straighten wrist as you can reinjure your tendon.  PURPOSE:  To prevent movement and for protection until injury can heal  CARE OF SPLINT:  Keep splint away from heat sources including: stove, radiator or furnace, or a car in sunlight. The splint can melt and will no longer fit you properly  Keep away from pets and children  Clean the splint with rubbing alcohol 1-2 times per day.  * During this time, make sure you also clean your hand/arm as instructed by your therapist and/or perform dressing changes as needed. Then dry hand/arm completely before replacing splint. (When cleaning hand/arm, keep it immobilized in same position until splint is replaced)  PRECAUTIONS/POTENTIAL PROBLEMS: *If you notice or experience increased pain, swelling, numbness, or a lingering reddened area from the splint: Contact your therapist immediately by calling [phone]. You must wear the splint for protection, but we will get you scheduled for adjustments as quickly as possible.  (If only straps or hooks need to be replaced and NO adjustments to the splint need to be made, just call the office ahead and let them know you are coming in)  If you have any medical concerns or signs of infection, please call your doctor immediately

## 2014-12-13 ENCOUNTER — Ambulatory Visit: Payer: BLUE CROSS/BLUE SHIELD | Admitting: Occupational Therapy

## 2015-01-08 ENCOUNTER — Ambulatory Visit (INDEPENDENT_AMBULATORY_CARE_PROVIDER_SITE_OTHER): Payer: BLUE CROSS/BLUE SHIELD | Admitting: Physician Assistant

## 2015-01-08 VITALS — BP 130/82 | HR 84 | Temp 98.3°F | Resp 16 | Ht 71.25 in | Wt 170.2 lb

## 2015-01-08 DIAGNOSIS — Z113 Encounter for screening for infections with a predominantly sexual mode of transmission: Secondary | ICD-10-CM

## 2015-01-08 DIAGNOSIS — R369 Urethral discharge, unspecified: Secondary | ICD-10-CM

## 2015-01-08 MED ORDER — CEFTRIAXONE SODIUM 250 MG IJ SOLR
250.0000 mg | Freq: Once | INTRAMUSCULAR | Status: AC
  Administered 2015-01-08: 250 mg via INTRAMUSCULAR

## 2015-01-08 MED ORDER — AZITHROMYCIN 500 MG PO TABS: ORAL_TABLET | ORAL | Status: DC
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
## 2015-01-08 NOTE — Patient Instructions (Signed)
I have sent zithromax to your pharmacy. Take 2 tabs once with food. Wear condoms for every sexual encounter. No sex for next 10 days. Return as needed.

## 2015-01-09 LAB — RPR

## 2015-01-09 LAB — HEPATITIS C ANTIBODY: HCV AB: NEGATIVE

## 2015-01-09 LAB — HEPATITIS B SURFACE ANTIGEN: Hepatitis B Surface Ag: NEGATIVE

## 2015-01-09 LAB — GC/CHLAMYDIA PROBE AMP
CT PROBE, AMP APTIMA: NEGATIVE
GC PROBE AMP APTIMA: POSITIVE — AB

## 2015-01-09 LAB — HIV ANTIBODY (ROUTINE TESTING W REFLEX): HIV 1&2 Ab, 4th Generation: NONREACTIVE

## 2015-01-10 NOTE — Progress Notes (Signed)
  Medical screening examination/treatment/procedure(s) were performed by non-physician practitioner and as supervising physician I was immediately available for consultation/collaboration.     

## 2015-04-05 DIAGNOSIS — S6412XA Injury of median nerve at wrist and hand level of left arm, initial encounter: Secondary | ICD-10-CM | POA: Insufficient documentation

## 2015-04-11 ENCOUNTER — Ambulatory Visit (INDEPENDENT_AMBULATORY_CARE_PROVIDER_SITE_OTHER): Payer: BLUE CROSS/BLUE SHIELD | Admitting: Family Medicine

## 2015-04-11 ENCOUNTER — Encounter: Payer: Self-pay | Admitting: Family Medicine

## 2015-04-11 VITALS — BP 126/76 | HR 71 | Temp 98.8°F | Resp 16 | Ht 70.0 in | Wt 168.6 lb

## 2015-04-11 DIAGNOSIS — Z91030 Bee allergy status: Secondary | ICD-10-CM | POA: Diagnosis not present

## 2015-04-11 DIAGNOSIS — L989 Disorder of the skin and subcutaneous tissue, unspecified: Secondary | ICD-10-CM | POA: Diagnosis not present

## 2015-04-11 DIAGNOSIS — Z113 Encounter for screening for infections with a predominantly sexual mode of transmission: Secondary | ICD-10-CM | POA: Diagnosis not present

## 2015-04-11 MED ORDER — MUPIROCIN 2 % EX OINT: TOPICAL_OINTMENT | CUTANEOUS | Status: DC

## 2015-04-11 MED ORDER — EPINEPHRINE 0.3 MG/0.3ML IJ SOAJ: 0.3000 mg | Freq: Once | INTRAMUSCULAR | Status: DC

## 2015-04-11 NOTE — Patient Instructions (Signed)
Have the epipen rx filled at either your regular drug store or at [facility] where they have a new generic device  Use the ointment on the lesion on your lip twice a day until gone- let me know if not resolved in one week Please come back in the next 2 hours to do a urine sample- do not pee until you come back in

## 2015-04-11 NOTE — Progress Notes (Addendum)
[facility] [address] [place] [address] [phone]  Date:  04/11/2015   Name:  Andrew [name]   DOB:  31-Oct-1980   MRN:  [id number]  PCP:  No PCP Per Patient    Chief Complaint: STD testing   History of Present Illness:  Andrew [name] is a 35 y.o. very pleasant male patient who presents with the following:  Here today seeking STD screening.  He is generally in good health.   He had intercourse last night- used a condom but it broke.  He is worried that he could have caught a disease  He has not noted any sx.  He has been tested for STI within 6 months   He has a bee allergy and needs an epipen for the summer Also this am he notes a crusted lesion on his left upper lip.  He does not get cold sores generally  There are no active problems to display for this patient.   Past Medical History  Diagnosis Date  . Broken finger 2001  . Staph skin infection 2001    need surgical debridment and IV antibiotics   . Allergy     bee sting    Past Surgical History  Procedure Laterality Date  . Nerve, tendon and artery repair Left 11/29/2014    Procedure:  REPAIR OF MEDIAN AND ULNA NERVE, REPAIR OF ULNA  ARTERY, REPAIR OF FLEXOR CARPAL ULNA TENDON ;  Surgeon: Dairl Ponder, MD;  Location: [facility] OR;  Service: Orthopedics;  Laterality: Left;    Social History  Substance Use Topics  . Smoking status: Current Every Day Smoker -- 0.50 packs/day  . Smokeless tobacco: Never Used  . Alcohol Use: No    Family History  Problem Relation Age of Onset  . Hypertension Mother   . Diabetes Mother     Allergies  Allergen Reactions  . Bee Venom Swelling    Medication list has been reviewed and updated.  Current Outpatient Prescriptions on File Prior to Visit  Medication Sig Dispense Refill  . oxyCODONE-acetaminophen (ROXICET) 5-325 MG per tablet Take 1 tablet by mouth every 4 (four) hours as needed for severe pain. (Patient not taking: Reported on 04/11/2015)  30 tablet 0   No current facility-administered medications on file prior to visit.    Review of Systems:  As per HPI- otherwise negative.   Physical Examination: Filed Vitals:   04/11/15 1426  BP: 126/76  Pulse: 71  Temp: 98.8 F (37.1 C)  Resp: 16   Filed Vitals:   04/11/15 1426  Height:  (1.778 m)  Weight: 168 lb 9.6 oz (76.476 kg)   Body mass index is 24.19 kg/(m^2). Ideal Body Weight: Weight in (lb) to have BMI = 25: 173.9  GEN: WDWN, NAD, Non-toxic, A & O x 3, looks well HEENT: Atraumatic, Normocephalic. Neck supple. No masses, No LAD. Likely impetigo at the left upper lip Ears and Nose: No external deformity. CV: RRR, No M/G/R. No JVD. No thrill. No extra heart sounds. PULM: CTA B, no wheezes, crackles, rhonchi. No retractions. No resp. distress. No accessory muscle use. ABD: S, NT, ND, +BS. No rebound. No HSM. EXTR: No c/c/e NEURO Normal gait.  PSYCH: Normally interactive. Conversant. Not depressed or anxious appearing.  Calm demeanor.    Assessment and Plan: Routine screening for STI (sexually transmitted infection) - Plan: GC/Chlamydia Probe Amp  Bee allergy status - Plan: EPINEPHrine 0.3 mg/0.3 mL IJ SOAJ injection  Skin  lesion of face - Plan: mupirocin ointment (BACTROBAN) 2 %  He will give a dirty catch urine for gonorrhea and chlamydia testing.  He will need blood testing in a few months Refilled epipen Mupirocin for lesion on his lip  Signed Abbe Amsterdam, MD  Called 1/19 with labs: [facility]

## 2015-04-12 ENCOUNTER — Encounter: Payer: Self-pay | Admitting: Family Medicine

## 2015-04-12 LAB — GC/CHLAMYDIA PROBE AMP
CT Probe RNA: NOT DETECTED
GC Probe RNA: NOT DETECTED

## 2015-04-25 ENCOUNTER — Ambulatory Visit: Payer: BLUE CROSS/BLUE SHIELD | Admitting: Family Medicine

## 2016-01-20 ENCOUNTER — Emergency Department (HOSPITAL_COMMUNITY)
Admission: EM | Admit: 2016-01-20 | Discharge: 2016-01-20 | Disposition: A | Discharge status: Home / Self Care | Payer: BLUE CROSS/BLUE SHIELD | Attending: Emergency Medicine | Admitting: Emergency Medicine

## 2016-01-20 DIAGNOSIS — L739 Follicular disorder, unspecified: Secondary | ICD-10-CM

## 2016-01-20 DIAGNOSIS — L0291 Cutaneous abscess, unspecified: Secondary | ICD-10-CM

## 2016-01-20 DIAGNOSIS — F17200 Nicotine dependence, unspecified, uncomplicated: Secondary | ICD-10-CM | POA: Insufficient documentation

## 2016-01-20 DIAGNOSIS — L0201 Cutaneous abscess of face: Secondary | ICD-10-CM | POA: Insufficient documentation

## 2016-01-20 MED ORDER — DOXYCYCLINE HYCLATE 100 MG PO CAPS: 100.0000 mg | ORAL_CAPSULE | Freq: Two times a day (BID) | ORAL | 0 refills | Status: DC

## 2016-01-20 MED ORDER — LIDOCAINE-EPINEPHRINE (PF) 2 %-1:200000 IJ SOLN
10.0000 mL | Freq: Once | INTRAMUSCULAR | Status: AC
  Administered 2016-01-20: 10 mL
  Filled 2016-01-20: qty 20

## 2016-01-20 NOTE — ED Provider Notes (Signed)
[facility]-EMERGENCY DEPT Provider Note   CSN: [id number] Arrival date & time: 01/20/16  1937   By signing my name below, I, Lennie MuckleRyan Watts, attest that this documentation has been prepared under the direction and in the presence of Roxy Horsemanobert Brigitte Soderberg, PA-C. Electronically Signed: Lennie Muckleyan Watts, Scribe. 01/20/16. 8:01 PM.     History   Chief Complaint Chief Complaint  Patient presents with  . Skin Problem   The history is provided by the patient. No language interpreter was used.     HPI Comments: Andrew [name] is a 35 y.o. male who presents to the Emergency Department complaining of a raised area on the skin by his right ear that has increased in size and pain over the last 4 days.  Has been oozing foul smelling pus. Denies any fevers or chills. The pain is minor but is aggravating him.   Past Medical History:  Diagnosis Date  . Allergy    bee sting  . Broken finger 2001  . Staph skin infection 2001   need surgical debridment and IV antibiotics     There are no active problems to display for this patient.   Past Surgical History:  Procedure Laterality Date  . NERVE, TENDON AND ARTERY REPAIR Left 11/29/2014   Procedure:  REPAIR OF MEDIAN AND ULNA NERVE, REPAIR OF ULNA  ARTERY, REPAIR OF FLEXOR CARPAL ULNA TENDON ;  Surgeon: Dairl PonderMatthew Weingold, MD;  Location: [facility] OR;  Service: Orthopedics;  Laterality: Left;       Home Medications    Prior to Admission medications   Medication Sig Start Date End Date Taking? Authorizing Provider  EPINEPHrine 0.3 mg/0.3 mL IJ SOAJ injection Inject 0.3 mLs (0.3 mg total) into the muscle once. Use as needed for bee string.  Ok to substitute generic device 04/11/15   Pearline CablesJessica C Copland, MD  mupirocin ointment (BACTROBAN) 2 % Apply to lesion twice a day 04/11/15   Pearline CablesJessica C Copland, MD  oxyCODONE-acetaminophen (ROXICET) 5-325 MG per tablet Take 1 tablet by mouth every 4 (four) hours as needed for severe pain. Patient not taking: Reported on 04/11/2015 11/30/14    Dairl PonderMatthew Weingold, MD    Family History Family History  Problem Relation Age of Onset  . Hypertension Mother   . Diabetes Mother     Social History Social History  Substance Use Topics  . Smoking status: Current Every Day Smoker    Packs/day: 0.50  . Smokeless tobacco: Never Used  . Alcohol use No     Allergies   Bee venom   Review of Systems Review of Systems  Skin:       Raised area by right ear, tender  All other systems reviewed and are negative.    Physical Exam Updated Vital Signs BP 138/78 (BP Location: Left Arm)   Pulse 78   Temp 98.2 F (36.8 C) (Oral)   Resp 18   SpO2 99%   Physical Exam  Constitutional: He is oriented to person, place, and time. He appears well-developed and well-nourished.  HENT:  Head: Normocephalic and atraumatic.  Cardiovascular: Normal rate.   Pulmonary/Chest: Effort normal.  Neurological: He is alert and oriented to person, place, and time.  Skin: Skin is warm and dry.  1cm cyst to right temple with thick white discharge, no surrounding erythema or cellulitis  Psychiatric: He has a normal mood and affect.  Nursing note and vitals reviewed.    ED Treatments / Results  DIAGNOSTIC STUDIES: Oxygen Saturation is 99% on RA, normal  by my interpretation.    COORDINATION OF CARE: 7:59 PM Discussed treatment plan with pt at bedside and pt agreed to plan.  Labs (all labs ordered are listed, but only abnormal results are displayed) Labs Reviewed - No data to display  EKG  EKG Interpretation None       Radiology No results found.  Procedures Procedures (including critical care time) INCISION AND DRAINAGE Performed by: Roxy HorsemanBROWNING, Kahla Risdon Consent: Verbal consent obtained. Risks and benefits: risks, benefits and alternatives were discussed Type: abscess  Body area: Right temple  Anesthesia: local infiltration  Incision was made with a 18ga needle.  Local anesthetic: lidocaine 2% with epinephrine  Anesthetic  total: 1 ml  Complexity: complex Blunt dissection to break up loculations  Drainage: purulent  Drainage amount: moderate  Packing material: not packed  Patient tolerance: Patient tolerated the procedure well with no immediate complications.    Medications Ordered in ED Medications  lidocaine-EPINEPHrine (XYLOCAINE W/EPI) 2 %-1:200000 (PF) injection 10 mL (not administered)     Initial Impression / Assessment and Plan / ED Course  I have reviewed the triage vital signs and the nursing notes.  Pertinent labs & imaging results that were available during my care of the patient were reviewed by me and considered in my medical decision making (see chart for details).  Clinical Course   Patient with skin abscess amenable to incision and drainage.  Abscess was not large enough to warrant packing or drain,  wound recheck in 2 days. Encouraged home warm soaks and flushing.  Mild signs of cellulitis is surrounding skin.  Will d/c to home.    Final Clinical Impressions(s) / ED Diagnoses   Final diagnoses:  Abscess  Folliculitis   I personally performed the services described in this documentation, which was scribed in my presence. The recorded information has been reviewed and is accurate.     New Prescriptions Discharge Medication List as of 01/20/2016  8:25 PM    START taking these medications   Details  doxycycline (VIBRAMYCIN) 100 MG capsule Take 1 capsule (100 mg total) by mouth 2 (two) times daily., Starting Sun 01/20/2016, Print         Roxy Horsemanobert Marlan Steward, PA-C 01/20/16 2110    Tilden FossaElizabeth Rees, MD 01/21/16 [phone]

## 2016-01-20 NOTE — ED Notes (Signed)
Patient was alert, oriented and stable upon discharge. RN went over AVS and patient had no further questions.  

## 2016-01-20 NOTE — ED Triage Notes (Signed)
Pt states that he has a raised area next to his R ear 4 days ago that has progressively gotten bigger and more painful. Alert and oriented.

## 2016-02-18 ENCOUNTER — Ambulatory Visit (INDEPENDENT_AMBULATORY_CARE_PROVIDER_SITE_OTHER): Payer: BLUE CROSS/BLUE SHIELD | Admitting: Family Medicine

## 2016-02-18 VITALS — BP 122/88 | HR 79 | Temp 98.3°F | Resp 16 | Ht 71.0 in | Wt 187.0 lb

## 2016-02-18 DIAGNOSIS — Z0000 Encounter for general adult medical examination without abnormal findings: Secondary | ICD-10-CM | POA: Diagnosis not present

## 2016-02-18 DIAGNOSIS — Z13220 Encounter for screening for lipoid disorders: Secondary | ICD-10-CM

## 2016-02-18 DIAGNOSIS — D696 Thrombocytopenia, unspecified: Secondary | ICD-10-CM

## 2016-02-18 DIAGNOSIS — Z8619 Personal history of other infectious and parasitic diseases: Secondary | ICD-10-CM

## 2016-02-18 DIAGNOSIS — Z113 Encounter for screening for infections with a predominantly sexual mode of transmission: Secondary | ICD-10-CM

## 2016-02-18 DIAGNOSIS — B360 Pityriasis versicolor: Secondary | ICD-10-CM

## 2016-02-18 DIAGNOSIS — Z131 Encounter for screening for diabetes mellitus: Secondary | ICD-10-CM

## 2016-02-18 MED ORDER — KETOCONAZOLE 200 MG PO TABS: 400.0000 mg | ORAL_TABLET | Freq: Once | ORAL | 0 refills | Status: AC

## 2016-02-18 NOTE — Patient Instructions (Addendum)
Please look into flu vaccine further. [email or web address]  Let me know if that is something you would like.  Tetanus vaccine is due if not received in past 10 years -check to see if your other doctor gave that to you.   Nizoral pills - 2 pills once, then exercise as discussed. Wait for at least 1 hour to shower after exercising.   Keeping you healthy  Get these tests  Blood pressure- Have your blood pressure checked once a year by your healthcare provider.  Normal blood pressure is 120/80.  Weight- Have your body mass index (BMI) calculated to screen for obesity.  BMI is a measure of body fat based on height and weight. You can also calculate your own BMI at [email or web address]  Cholesterol- Have your cholesterol checked regularly starting at age 33, sooner may be necessary if you have diabetes, high blood pressure, if a family member developed heart diseases at an early age or if you smoke.   Chlamydia, HIV, and other sexual transmitted disease- Get screened each year until the age of 78 then within three months of each new sexual partner.  Diabetes- Have your blood sugar checked regularly if you have high blood pressure, high cholesterol, a family history of diabetes or if you are overweight.  Get these vaccines  Flu shot- Every fall.  Tetanus shot- Every 10 years.  Menactra- Single dose; prevents meningitis.  Take these steps  Don't smoke- If you do smoke, ask your healthcare provider about quitting. For tips on how to quit, go to www.smokefree.gov or call 1-800-QUIT-NOW.  Be physically active- Exercise 5 days a week for at least 30 minutes.  If you are not already physically active start slow and gradually work up to 30 minutes of moderate physical activity.  Examples of moderate activity include walking briskly, mowing the yard, dancing, swimming bicycling, etc.  Eat a healthy diet- Eat a variety of healthy foods such as fruits, vegetables, low fat milk, low fat cheese, yogurt,  lean meats, poultry, fish, beans, tofu, etc.  For more information on healthy eating, go to www.thenutritionsource.org  Drink alcohol in moderation- Limit alcohol intake two drinks or less a day.  Never drink and drive.  Dentist- Brush and floss teeth twice daily; visit your dentis twice a year.  Depression-Your emotional health is as important as your physical health.  If you're feeling down, losing interest in things you normally enjoy please talk with your healthcare provider.  Gun Safety- If you keep a gun in your home, keep it unloaded and with the safety lock on.  Bullets should be stored separately.  Helmet use- Always wear a helmet when riding a motorcycle, bicycle, rollerblading or skateboarding.  Safe sex- If you may be exposed to a sexually transmitted infection, use a condom  Seat belts- Seat bels can save your life; always wear one.  Smoke/Carbon Monoxide detectors- These detectors need to be installed on the appropriate level of your home.  Replace batteries at least once a year.  Skin Cancer- When out in the sun, cover up and use sunscreen SPF 15 or higher.  Violence- If anyone is threatening or hurting you, please tell your healthcare provider.   Steps to Quit Smoking Smoking tobacco can be harmful to your health and can affect almost every organ in your body. Smoking puts you, and those around you, at risk for developing many serious chronic diseases. Quitting smoking is difficult, but it is one of the best things that you can do for  your health. It is never too late to quit. What are the benefits of quitting smoking? When you quit smoking, you lower your risk of developing serious diseases and conditions, such as:  Lung cancer or lung disease, such as COPD.  Heart disease.  Stroke.  Heart attack.  Infertility.  Osteoporosis and bone fractures. Additionally, symptoms such as coughing, wheezing, and shortness of breath may get better when you quit. You may also  find that you get sick less often because your body is stronger at fighting off colds and infections. If you are pregnant, quitting smoking can help to reduce your chances of having a baby of low birth weight. How do I get ready to quit? When you decide to quit smoking, create a plan to make sure that you are successful. Before you quit:  Pick a date to quit. Set a date within the next two weeks to give you time to prepare.  Write down the reasons why you are quitting. Keep this list in places where you will see it often, such as on your bathroom mirror or in your car or wallet.  Identify the people, places, things, and activities that make you want to smoke (triggers) and avoid them. Make sure to take these actions:  Throw away all cigarettes at home, at work, and in your car.  Throw away smoking accessories, such as Set designer.  Clean your car and make sure to empty the ashtray.  Clean your home, including curtains and carpets.  Tell your family, friends, and coworkers that you are quitting. Support from your loved ones can make quitting easier.  Talk with your health care provider about your options for quitting smoking.  Find out what treatment options are covered by your health insurance. What strategies can I use to quit smoking? Talk with your healthcare provider about different strategies to quit smoking. Some strategies include:  Quitting smoking altogether instead of gradually lessening how much you smoke over a period of time. Research shows that quitting "cold [place]" is more successful than gradually quitting.  Attending in-person counseling to help you build problem-solving skills. You are more likely to have success in quitting if you attend several counseling sessions. Even short sessions of 10 minutes can be effective.  Finding resources and support systems that can help you to quit smoking and remain smoke-free after you quit. These resources are most  helpful when you use them often. They can include:  Online chats with a Veterinary surgeon.  Telephone quitlines.  Printed Materials engineer.  Support groups or group counseling.  Text messaging programs.  Mobile phone applications.  Taking medicines to help you quit smoking. (If you are pregnant or breastfeeding, talk with your health care provider first.) Some medicines contain nicotine and some do not. Both types of medicines help with cravings, but the medicines that include nicotine help to relieve withdrawal symptoms. Your health care provider may recommend:  Nicotine patches, gum, or lozenges.  Nicotine inhalers or sprays.  Non-nicotine medicine that is taken by mouth. Talk with your health care provider about combining strategies, such as taking medicines while you are also receiving in-person counseling. Using these two strategies together makes you more likely to succeed in quitting than if you used either strategy on its own. If you are pregnant or breastfeeding, talk with your health care provider about finding counseling or other support strategies to quit smoking. Do not take medicine to help you quit smoking unless told to do so by your  health care provider. What things can I do to make it easier to quit? Quitting smoking might feel overwhelming at first, but there is a lot that you can do to make it easier. Take these important actions:  Reach out to your family and friends and ask that they support and encourage you during this time. Call telephone quitlines, reach out to support groups, or work with a counselor for support.  Ask people who smoke to avoid smoking around you.  Avoid places that trigger you to smoke, such as bars, parties, or smoke-break areas at work.  Spend time around people who do not smoke.  Lessen stress in your life, because stress can be a smoking trigger for some people. To lessen stress, try:  Exercising regularly.  Deep-breathing  exercises.  Yoga.  Meditating.  Performing a body scan. This involves closing your eyes, scanning your body from head to toe, and noticing which parts of your body are particularly tense. Purposefully relax the muscles in those areas.  Download or purchase mobile phone or tablet apps (applications) that can help you stick to your quit plan by providing reminders, tips, and encouragement. There are many free apps, such as QuitGuide from the [employer] developer(Centers for Disease Control and Prevention). You can find other support for quitting smoking (smoking cessation) through smokefree.gov and other websites. How will I feel when I quit smoking? Within the first 24 hours of quitting smoking, you may start to feel some withdrawal symptoms. These symptoms are usually most noticeable 2-3 days after quitting, but they usually do not last beyond 2-3 weeks. Changes or symptoms that you might experience include:  Mood swings.  Restlessness, anxiety, or irritation.  Difficulty concentrating.  Dizziness.  Strong cravings for sugary foods in addition to nicotine.  Mild weight gain.  Constipation.  Nausea.  Coughing or a sore throat.  Changes in how your medicines work in your body.  A depressed mood.  Difficulty sleeping (insomnia). After the first 2-3 weeks of quitting, you may start to notice more positive results, such as:  Improved sense of smell and taste.  Decreased coughing and sore throat.  Slower heart rate.  Lower blood pressure.  Clearer skin.  The ability to breathe more easily.  Fewer sick days. Quitting smoking is very challenging for most people. Do not get discouraged if you are not successful the first time. Some people need to make many attempts to quit before they achieve long-term success. Do your best to stick to your quit plan, and talk with your health care provider if you have any questions or concerns. This information is not intended to replace advice given to  you by your health care provider. Make sure you discuss any questions you have with your health care provider. Document Released: 03/04/2001 Document Revised: 11/06/2015 Document Reviewed: 07/25/2014 Elsevier Interactive Patient Education  2017 Elsevier Inc.  [facility] offers smoking cessation clinics. Registration is required. To register call [phone] or register online at [email or web address].  Tinea Versicolor Tinea versicolor is a common fungal infection of the skin. It causes a rash that appears as light or dark patches on the skin. The rash most often occurs on the chest, back, neck, or upper arms. This condition is more common during warm weather. Other than affecting how your skin looks, tinea versicolor usually does not cause other problems. In most cases, the infection goes away in a few weeks with treatment. It may take a few months for the patches on your skin to  clear up. What are the causes? Tinea versicolor occurs when a type of fungus that is normally present on the skin starts to overgrow. This fungus is a kind of yeast. The exact cause of the overgrowth is not known. This condition cannot be passed from one person to another (noncontagious). What increases the risk? This condition is more likely to develop when certain factors are present, such as:  Heat and humidity.  Sweating too much.  Hormone changes.  Oily skin.  A weak defense (immune) system. What are the signs or symptoms? Symptoms of this condition may include:  A rash on your skin that is made up of light or dark patches. The rash may have:  Patches of tan or pink spots on light skin.  Patches of white or brown spots on dark skin.  Patches of skin that do not tan.  Well-marked edges.  Scales on the discolored areas.  Mild itching. How is this diagnosed? A health care provider can usually diagnose this condition by looking at your skin. During the exam, he or she may use ultraviolet light to help  determine the extent of the infection. In some cases, a skin sample may be taken by scraping the rash. This sample will be viewed under a microscope to check for yeast overgrowth. How is this treated? Treatment for this condition may include:  Dandruff shampoo that is applied to the affected skin during showers or bathing.  Over-the-counter medicated skin cream, lotion, or soaps.  Prescription antifungal medicine in the form of skin cream or pills.  Medicine to help reduce itching. Follow these instructions at home:  Take medicines only as directed by your health care provider.  Apply dandruff shampoo to the affected area if told to do so by your health care provider. You may be instructed to scrub the affected skin for several minutes each day.  Do not scratch the affected area of skin.  Avoid hot and humid conditions.  Do not use tanning booths.  Try to avoid sweating a lot. Contact a health care provider if:  Your symptoms get worse.  You have a fever.  You have redness, swelling, or pain at the site of your rash.  You have fluid, blood, or pus coming from your rash.  Your rash returns after treatment. This information is not intended to replace advice given to you by your health care provider. Make sure you discuss any questions you have with your health care provider. Document Released: 03/07/2000 Document Revised: 11/11/2015 Document Reviewed: 12/20/2013 Elsevier Interactive Patient Education  2017 [employer]    IF you received an x-ray today, you will receive an invoice from [facility]. Please contact [facility] at [phone] with questions or concerns regarding your invoice.   IF you received labwork today, you will receive an invoice from [facility]. Please contact [facility] at [phone] with questions or concerns regarding your invoice.   Our billing staff will not be able to assist you with questions  regarding bills from these companies.  You will be contacted with the lab results as soon as they are available. The fastest way to get your results is to activate your My Chart account. Instructions are located on the last page of this paperwork. If you have not heard from us regarding the results in 2 weeks, please contact this office.

## 2016-02-18 NOTE — Progress Notes (Signed)
Subjective:  By signing my name below, I, Essence Howell, attest that this documentation has been prepared under the direction and in the presence of Shade Flood, MD Electronically Signed: Charline Bills, ED Scribe 02/18/2016 at 5:25 PM.   Patient ID: Josie Dixon, male    DOB: 1980/11/09, 35 y.o.   MRN: [id number]  Chief Complaint  Patient presents with  . Annual Exam    CPE   HPI HPI Comments: FARHAN JEAN is a 35 y.o. male who presents to the [facility] for an annual exam. 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 S/p surgery in September.    Rash Pt presents with a rash to the upper chest, both arms, neck and upper back. Pt describes the rash as "splotching". He has tried [employer] which temporarily resolves rash. Pt states that his father had similar rash which resolved after taking a prescribed fungal pill.   Tobacco Abuse Pt states that he quit smoking 2 weeks ago and has noticed an increase in his energy since. He is currently on a patch.   CA Screening  No family h/o listed on intake form but pt reports that his mother has a h/o breast CA.   Immunizations  There is no immunization history on file for this patient. Pt suspects that his tetanus is UTD and it was administered when he had surgery earlier this year. Pt does not want a flu vaccine.  Low Platelet Count Pt had cholesterol testing in January that was normal but borderline. Pt had a low platelet count at that time.  Lab Results  Component Value Date   WBC 6.8 11/29/2014   HGB 14.3 11/29/2014   HCT 44.4 11/29/2014   MCV 89.5 11/29/2014   PLT 176 11/29/2014    Depression screen PHQ 2/9 02/18/2016 04/11/2015 01/08/2015 06/12/2014  Decreased Interest 0 - 0 0  Down, Depressed, Hopeless 0 0 0 0  PHQ - 2 Score 0 0 0 0     Visual Acuity Screening   Right eye Left eye Both eyes  Without correction: 20/13 20/13 20/13   With correction:        Dentist Pt is followed by a dentist every 6 months.   STI Testing Requests STI testing today. Positive gonorrhea test in October 2016. Other testing was negative. Repeat gonorrhea/chlamydia was normal in January. Pt reports 1 new sexual partners since last testing.   Exercise  Pt is not currently exercising outside of work.   There are no active problems to display for this patient.  Past Medical History:  Diagnosis Date  . Allergy    bee sting  . Broken finger 2001  . Staph skin infection 2001   need surgical debridment and IV antibiotics    Past Surgical History:  Procedure Laterality Date  . NERVE, TENDON AND ARTERY REPAIR Left 11/29/2014   Procedure:  REPAIR OF MEDIAN AND ULNA NERVE, REPAIR OF ULNA  ARTERY, REPAIR OF FLEXOR CARPAL ULNA TENDON ;  Surgeon: Dairl Ponder, MD;  Location: [facility] OR;  Service: Orthopedics;  Laterality: Left;   Allergies  Allergen Reactions  . Bee Venom Swelling   Prior to Admission medications   Medication Sig Start Date End Date Taking? Authorizing Provider  doxycycline (VIBRAMYCIN) 100 MG capsule Take 1 capsule (100 mg total) by mouth 2 (two) times daily. 01/20/16  Yes Roxy Horseman, PA-C  EPINEPHrine 0.3 mg/0.3 mL IJ SOAJ injection Inject 0.3  mLs (0.3 mg total) into the muscle once. Use as needed for bee string.  Ok to substitute generic device 04/11/15  Yes Gwenlyn FoundJessica C Copland, MD  mupirocin ointment (BACTROBAN) 2 % Apply to lesion twice a day 04/11/15  Yes Gwenlyn FoundJessica C Copland, MD  oxyCODONE-acetaminophen (ROXICET) 5-325 MG per tablet Take 1 tablet by mouth every 4 (four) hours as needed for severe pain. 11/30/14  Yes Dairl PonderMatthew Weingold, MD   Social History   Social History  . Marital status: Single    Spouse name: N/A  . Number of children: N/A  . Years of education: N/A   Occupational History  . Not on file.   Social History Main Topics  . Smoking status: Current Every Day Smoker    Packs/day: 0.50  . Smokeless tobacco: Never Used  .  Alcohol use No  . Drug use:     Types: Marijuana  . Sexual activity: Not on file   Other Topics Concern  . Not on file   Social History Narrative  . No narrative on file   Review of Systems  Skin: Positive for color change and rash.  13 point review of systems per patient health survey noted.  Negative other than above.     Objective:   Physical Exam  Constitutional: He is oriented to person, place, and time. He appears well-developed and well-nourished.  HENT:  Head: Normocephalic and atraumatic.  Right Ear: External ear normal.  Left Ear: External ear normal.  Mouth/Throat: Oropharynx is clear and moist.  Eyes: Conjunctivae and EOM are normal. Pupils are equal, round, and reactive to light.  Neck: Normal range of motion. Neck supple. No thyromegaly present.  Cardiovascular: Normal rate, regular rhythm, normal heart sounds and intact distal pulses.   Pulmonary/Chest: Effort normal and breath sounds normal. No respiratory distress. He has no wheezes.  Abdominal: Soft. He exhibits no distension. There is no tenderness. Hernia confirmed negative in the right inguinal area.  Musculoskeletal: Normal range of motion. He exhibits no edema or tenderness.  Lymphadenopathy:    He has no cervical adenopathy.  Neurological: He is alert and oriented to person, place, and time. He has normal reflexes.  Skin: Skin is warm and dry. Rash noted.  Scattered hypopigmented rash across upper chest, back, neck, R arm and a few areas on upper L arm.   Psychiatric: He has a normal mood and affect. His behavior is normal.  Vitals reviewed.   Vitals:   02/18/16 1555  BP: 122/88  Pulse: 79  Resp: 16  Temp: 98.3 F (36.8 C)  TempSrc: Oral  SpO2: 99%  Weight: 187 lb (84.8 kg)  Height: 5\' 11"  (1.803 m)      Assessment & Plan:    Josie DixonJames R Rager is a 35 y.o. male Annual physical exam  - -anticipatory guidance as below in AVS, screening labs above. Health maintenance items as above in HPI  discussed/recommended as applicable.   History of gonorrhea - Plan: RPR, HIV antibody, GC/Chlamydia Probe Amp, Routine screening for STI (sexually transmitted infection) - Plan: RPR, HIV antibody, GC/Chlamydia Probe Amp  -check STI's, safer sex practices  Screening for diabetes mellitus - Plan: COMPLETE METABOLIC PANEL WITH GFR  Screening for hyperlipidemia - Plan: Lipid panel  Thrombocytopenia (HCC) - Plan: CBC  - mild pancytopenia on prior reading. Repeat cbc.   Tinea versicolor - Plan: ketoconazole (NIZORAL) 200 MG tablet  - nizoral 400mg  x 1.  Handout given.   Meds ordered this encounter  Medications  .  ketoconazole (NIZORAL) 200 MG tablet    Sig: Take 2 tablets (400 mg total) by mouth once.    Dispense:  2 tablet    Refill:  0   Patient Instructions    Please look into flu vaccine further. [email or web address]  Let me know if that is something you would like.  Tetanus vaccine is due if not received in past 10 years -check to see if your other doctor gave that to you.   Nizoral pills - 2 pills once, then exercise as discussed. Wait for at least 1 hour to shower after exercising.   Keeping you healthy  Get these tests  Blood pressure- Have your blood pressure checked once a year by your healthcare provider.  Normal blood pressure is 120/80.  Weight- Have your body mass index (BMI) calculated to screen for obesity.  BMI is a measure of body fat based on height and weight. You can also calculate your own BMI at https://www.west-esparza.com/.  Cholesterol- Have your cholesterol checked regularly starting at age 83, sooner may be necessary if you have diabetes, high blood pressure, if a family member developed heart diseases at an early age or if you smoke.   Chlamydia, HIV, and other sexual transmitted disease- Get screened each year until the age of 69 then within three months of each new sexual partner.  Diabetes- Have your blood sugar checked regularly if you have high blood pressure,  high cholesterol, a family history of diabetes or if you are overweight.  Get these vaccines  Flu shot- Every fall.  Tetanus shot- Every 10 years.  Menactra- Single dose; prevents meningitis.  Take these steps  Don't smoke- If you do smoke, ask your healthcare provider about quitting. For tips on how to quit, go to www.smokefree.gov or call 1-800-QUIT-NOW.  Be physically active- Exercise 5 days a week for at least 30 minutes.  If you are not already physically active start slow and gradually work up to 30 minutes of moderate physical activity.  Examples of moderate activity include walking briskly, mowing the yard, dancing, swimming bicycling, etc.  Eat a healthy diet- Eat a variety of healthy foods such as fruits, vegetables, low fat milk, low fat cheese, yogurt, lean meats, poultry, fish, beans, tofu, etc.  For more information on healthy eating, go to www.thenutritionsource.org  Drink alcohol in moderation- Limit alcohol intake two drinks or less a day.  Never drink and drive.  Dentist- Brush and floss teeth twice daily; visit your dentis twice a year.  Depression-Your emotional health is as important as your physical health.  If you're feeling down, losing interest in things you normally enjoy please talk with your healthcare provider.  Gun Safety- If you keep a gun in your home, keep it unloaded and with the safety lock on.  Bullets should be stored separately.  Helmet use- Always wear a helmet when riding a motorcycle, bicycle, rollerblading or skateboarding.  Safe sex- If you may be exposed to a sexually transmitted infection, use a condom  Seat belts- Seat bels can save your life; always wear one.  Smoke/Carbon Monoxide detectors- These detectors need to be installed on the appropriate level of your home.  Replace batteries at least once a year.  Skin Cancer- When out in the sun, cover up and use sunscreen SPF 15 or higher.  Violence- If anyone is threatening or hurting  you, please tell your healthcare provider.   Steps to Quit Smoking Smoking tobacco can be harmful to your health and can  affect almost every organ in your body. Smoking puts you, and those around you, at risk for developing many serious chronic diseases. Quitting smoking is difficult, but it is one of the best things that you can do for your health. It is never too late to quit. What are the benefits of quitting smoking? When you quit smoking, you lower your risk of developing serious diseases and conditions, such as:  Lung cancer or lung disease, such as COPD.  Heart disease.  Stroke.  Heart attack.  Infertility.  Osteoporosis and bone fractures. Additionally, symptoms such as coughing, wheezing, and shortness of breath may get better when you quit. You may also find that you get sick less often because your body is stronger at fighting off colds and infections. If you are pregnant, quitting smoking can help to reduce your chances of having a baby of low birth weight. How do I get ready to quit? When you decide to quit smoking, create a plan to make sure that you are successful. Before you quit:  Pick a date to quit. Set a date within the next two weeks to give you time to prepare.  Write down the reasons why you are quitting. Keep this list in places where you will see it often, such as on your bathroom mirror or in your car or wallet.  Identify the people, places, things, and activities that make you want to smoke (triggers) and avoid them. Make sure to take these actions:  Throw away all cigarettes at home, at work, and in your car.  Throw away smoking accessories, such as Set designer.  Clean your car and make sure to empty the ashtray.  Clean your home, including curtains and carpets.  Tell your family, friends, and coworkers that you are quitting. Support from your loved ones can make quitting easier.  Talk with your health care provider about your options for  quitting smoking.  Find out what treatment options are covered by your health insurance. What strategies can I use to quit smoking? Talk with your healthcare provider about different strategies to quit smoking. Some strategies include:  Quitting smoking altogether instead of gradually lessening how much you smoke over a period of time. Research shows that quitting "cold [place]" is more successful than gradually quitting.  Attending in-person counseling to help you build problem-solving skills. You are more likely to have success in quitting if you attend several counseling sessions. Even short sessions of 10 minutes can be effective.  Finding resources and support systems that can help you to quit smoking and remain smoke-free after you quit. These resources are most helpful when you use them often. They can include:  Online chats with a Veterinary surgeon.  Telephone quitlines.  Printed Materials engineer.  Support groups or group counseling.  Text messaging programs.  Mobile phone applications.  Taking medicines to help you quit smoking. (If you are pregnant or breastfeeding, talk with your health care provider first.) Some medicines contain nicotine and some do not. Both types of medicines help with cravings, but the medicines that include nicotine help to relieve withdrawal symptoms. Your health care provider may recommend:  Nicotine patches, gum, or lozenges.  Nicotine inhalers or sprays.  Non-nicotine medicine that is taken by mouth. Talk with your health care provider about combining strategies, such as taking medicines while you are also receiving in-person counseling. Using these two strategies together makes you more likely to succeed in quitting than if you used either strategy on its own.  If you are pregnant or breastfeeding, talk with your health care provider about finding counseling or other support strategies to quit smoking. Do not take medicine to help you quit smoking  unless told to do so by your health care provider. What things can I do to make it easier to quit? Quitting smoking might feel overwhelming at first, but there is a lot that you can do to make it easier. Take these important actions:  Reach out to your family and friends and ask that they support and encourage you during this time. Call telephone quitlines, reach out to support groups, or work with a counselor for support.  Ask people who smoke to avoid smoking around you.  Avoid places that trigger you to smoke, such as bars, parties, or smoke-break areas at work.  Spend time around people who do not smoke.  Lessen stress in your life, because stress can be a smoking trigger for some people. To lessen stress, try:  Exercising regularly.  Deep-breathing exercises.  Yoga.  Meditating.  Performing a body scan. This involves closing your eyes, scanning your body from head to toe, and noticing which parts of your body are particularly tense. Purposefully relax the muscles in those areas.  Download or purchase mobile phone or tablet apps (applications) that can help you stick to your quit plan by providing reminders, tips, and encouragement. There are many free apps, such as QuitGuide from the [employer] developer for Disease Control and Prevention). You can find other support for quitting smoking (smoking cessation) through smokefree.gov and other websites. How will I feel when I quit smoking? Within the first 24 hours of quitting smoking, you may start to feel some withdrawal symptoms. These symptoms are usually most noticeable 2-3 days after quitting, but they usually do not last beyond 2-3 weeks. Changes or symptoms that you might experience include:  Mood swings.  Restlessness, anxiety, or irritation.  Difficulty concentrating.  Dizziness.  Strong cravings for sugary foods in addition to nicotine.  Mild weight gain.  Constipation.  Nausea.  Coughing or a sore throat.  Changes in  how your medicines work in your body.  A depressed mood.  Difficulty sleeping (insomnia). After the first 2-3 weeks of quitting, you may start to notice more positive results, such as:  Improved sense of smell and taste.  Decreased coughing and sore throat.  Slower heart rate.  Lower blood pressure.  Clearer skin.  The ability to breathe more easily.  Fewer sick days. Quitting smoking is very challenging for most people. Do not get discouraged if you are not successful the first time. Some people need to make many attempts to quit before they achieve long-term success. Do your best to stick to your quit plan, and talk with your health care provider if you have any questions or concerns. This information is not intended to replace advice given to you by your health care provider. Make sure you discuss any questions you have with your health care provider. Document Released: 03/04/2001 Document Revised: 11/06/2015 Document Reviewed: 07/25/2014 Elsevier Interactive Patient Education  2017 Elsevier Inc.  [facility] offers smoking cessation clinics. Registration is required. To register call [phone] or register online at [email or web address].  Tinea Versicolor Tinea versicolor is a common fungal infection of the skin. It causes a rash that appears as light or dark patches on the skin. The rash most often occurs on the chest, back, neck, or upper arms. This condition is more common during warm weather. Other than affecting  how your skin looks, tinea versicolor usually does not cause other problems. In most cases, the infection goes away in a few weeks with treatment. It may take a few months for the patches on your skin to clear up. What are the causes? Tinea versicolor occurs when a type of fungus that is normally present on the skin starts to overgrow. This fungus is a kind of yeast. The exact cause of the overgrowth is not known. This condition cannot be passed from one person to another  (noncontagious). What increases the risk? This condition is more likely to develop when certain factors are present, such as:  Heat and humidity.  Sweating too much.  Hormone changes.  Oily skin.  A weak defense (immune) system. What are the signs or symptoms? Symptoms of this condition may include:  A rash on your skin that is made up of light or dark patches. The rash may have:  Patches of tan or pink spots on light skin.  Patches of white or brown spots on dark skin.  Patches of skin that do not tan.  Well-marked edges.  Scales on the discolored areas.  Mild itching. How is this diagnosed? A health care provider can usually diagnose this condition by looking at your skin. During the exam, he or she may use ultraviolet light to help determine the extent of the infection. In some cases, a skin sample may be taken by scraping the rash. This sample will be viewed under a microscope to check for yeast overgrowth. How is this treated? Treatment for this condition may include:  Dandruff shampoo that is applied to the affected skin during showers or bathing.  Over-the-counter medicated skin cream, lotion, or soaps.  Prescription antifungal medicine in the form of skin cream or pills.  Medicine to help reduce itching. Follow these instructions at home:  Take medicines only as directed by your health care provider.  Apply dandruff shampoo to the affected area if told to do so by your health care provider. You may be instructed to scrub the affected skin for several minutes each day.  Do not scratch the affected area of skin.  Avoid hot and humid conditions.  Do not use tanning booths.  Try to avoid sweating a lot. Contact a health care provider if:  Your symptoms get worse.  You have a fever.  You have redness, swelling, or pain at the site of your rash.  You have fluid, blood, or pus coming from your rash.  Your rash returns after treatment. This information  is not intended to replace advice given to you by your health care provider. Make sure you discuss any questions you have with your health care provider. Document Released: 03/07/2000 Document Revised: 11/11/2015 Document Reviewed: 12/20/2013 Elsevier Interactive Patient Education  2017 [employer]    IF you received an x-ray today, you will receive an invoice from [facility]. Please contact [facility] at [phone] with questions or concerns regarding your invoice.   IF you received labwork today, you will receive an invoice from [facility]. Please contact [facility] at [phone] with questions or concerns regarding your invoice.   Our billing staff will not be able to assist you with questions regarding bills from these companies.  You will be contacted with the lab results as soon as they are available. The fastest way to get your results is to activate your My Chart account. Instructions are located on the last page of this paperwork. If you have  not heard from [place] regarding the results in 2 weeks, please contact this office.        I personally performed the services described in this documentation, which was scribed in my presence. The recorded information has been reviewed and considered, and addended by me as needed.   Signed,   Meredith Staggers, MD [facility].  02/18/16 7:18 PM

## 2016-02-19 LAB — COMPLETE METABOLIC PANEL WITH GFR
ALT: 10 U/L (ref 9–46)
AST: 15 U/L (ref 10–40)
Albumin: 4.7 g/dL (ref 3.6–5.1)
Alkaline Phosphatase: 78 U/L (ref 40–115)
BILIRUBIN TOTAL: 0.6 mg/dL (ref 0.2–1.2)
BUN: 10 mg/dL (ref 7–25)
CO2: 31 mmol/L (ref 20–31)
CREATININE: 0.99 mg/dL (ref 0.60–1.35)
Calcium: 9.5 mg/dL (ref 8.6–10.3)
Chloride: 101 mmol/L (ref 98–110)
GFR, Est Non African American: >89 mL/min (ref >=60)
GLUCOSE: 91 mg/dL (ref 65–99)
Potassium: 4 mmol/L (ref 3.5–5.3)
SODIUM: 138 mmol/L (ref 135–146)
TOTAL PROTEIN: 7.2 g/dL (ref 6.1–8.1)

## 2016-02-19 LAB — LIPID PANEL
Cholesterol: 156 mg/dL (ref <200)
HDL: 40 mg/dL — ABNORMAL LOW (ref >40)
LDL CALC: 104 mg/dL — AB (ref <100)
Total CHOL/HDL Ratio: 3.9 Ratio (ref <5.0)
Triglycerides: 58 mg/dL (ref <150)
VLDL: 12 mg/dL (ref <30)

## 2016-02-19 LAB — CBC
HCT: 43.1 % (ref 38.5–50.0)
HEMOGLOBIN: 14.1 g/dL (ref 13.2–17.1)
MCH: 28.3 pg (ref 27.0–33.0)
MCHC: 32.7 g/dL (ref 32.0–36.0)
MCV: 86.5 fL (ref 80.0–100.0)
MPV: 12.6 fL — ABNORMAL HIGH (ref 7.5–12.5)
Platelets: 136 10*3/uL — ABNORMAL LOW (ref 140–400)
RBC: 4.98 MIL/uL (ref 4.20–5.80)
RDW: 14 % (ref 11.0–15.0)
WBC: 3.6 10*3/uL — AB (ref 3.8–10.8)

## 2016-02-19 LAB — HIV ANTIBODY (ROUTINE TESTING W REFLEX): HIV: NONREACTIVE

## 2016-02-20 LAB — RPR

## 2016-02-20 LAB — GC/CHLAMYDIA PROBE AMP
CT PROBE, AMP APTIMA: NOT DETECTED
GC Probe RNA: NOT DETECTED

## 2016-03-01 ENCOUNTER — Encounter: Payer: Self-pay | Admitting: *Deleted

## 2018-08-23 ENCOUNTER — Encounter (HOSPITAL_COMMUNITY): Payer: Self-pay | Admitting: Emergency Medicine

## 2018-08-23 ENCOUNTER — Ambulatory Visit (HOSPITAL_COMMUNITY)
Admission: EM | Admit: 2018-08-23 | Discharge: 2018-08-23 | Disposition: A | Discharge status: Home / Self Care | Payer: BLUE CROSS/BLUE SHIELD | Attending: Family Medicine | Admitting: Family Medicine

## 2018-08-23 ENCOUNTER — Other Ambulatory Visit: Payer: Self-pay

## 2018-08-23 DIAGNOSIS — Z7251 High risk heterosexual behavior: Secondary | ICD-10-CM | POA: Insufficient documentation

## 2018-08-23 DIAGNOSIS — R319 Hematuria, unspecified: Secondary | ICD-10-CM

## 2018-08-23 LAB — POCT URINALYSIS DIP (DEVICE)
Bilirubin Urine: NEGATIVE
Glucose, UA: NEGATIVE mg/dL
Hgb urine dipstick: NEGATIVE
Ketones, ur: NEGATIVE mg/dL
Leukocytes,Ua: NEGATIVE
Nitrite: NEGATIVE
Protein, ur: NEGATIVE mg/dL
Specific Gravity, Urine: 1.025 (ref 1.005–1.030)
Urobilinogen, UA: 1 mg/dL (ref 0.0–1.0)
pH: 7 (ref 5.0–8.0)

## 2018-08-23 NOTE — ED Triage Notes (Signed)
Pt here for hematuria and unsure if could be related to STD exposure

## 2018-08-23 NOTE — Discharge Instructions (Signed)
Please monitor your symptoms to discuss with your PCP. We will call you with the results of your lab work and treat if indicated. Please return if you develop abdominal, pelvic, low back pain, discharge, fever.

## 2018-08-23 NOTE — ED Provider Notes (Signed)
[facility]    CSN: [id number] Arrival date & time: 08/23/18  1320     History   Chief Complaint Chief Complaint  Patient presents with   Hematuria    HPI Andrew [name] is a 38 y.o. male presenting for acute concern of blood in his urine.  Patient states that he noticed this this morning.  Denies having previous episodes.  Patient denies urinary urgency/frequency, burning with urination, dysuria, dribbling abdominal pain, flank pain, pelvic pain.  Patient states that he drank heavily over the weekend with friends and is unsure if this could contribute: Denies LOC, vomiting, irritation, tremors, seizure.  Patient also wondering if he can be checked for STDs.  Patient currently sexually active, not routinely wearing condoms.  Patient has history of HPV, no known history of HIV/syphilis.  Patient denies penile pain, testicular pain, penile discharge.  He denies issues with bowel movements as hematochezia, melena, pain with defecation.    Past Medical History:  Diagnosis Date   Allergy    bee sting   Broken finger 2001   Staph skin infection 2001   need surgical debridment and IV antibiotics     There are no active problems to display for this patient.   Past Surgical History:  Procedure Laterality Date   NERVE, TENDON AND ARTERY REPAIR Left 11/29/2014   Procedure:  REPAIR OF MEDIAN AND ULNA NERVE, REPAIR OF ULNA  ARTERY, REPAIR OF FLEXOR CARPAL ULNA TENDON ;  Surgeon: Dairl Ponder, MD;  [facility];  Service: Orthopedics;  Laterality: Left;       Home Medications    Prior to Admission medications   Medication Sig Start Date End Date Taking? Authorizing Provider  doxycycline (VIBRAMYCIN) 100 MG capsule Take 1 capsule (100 mg total) by mouth 2 (two) times daily. Patient not taking: Reported on 08/23/2018 01/20/16   Roxy Horseman, PA-C  EPINEPHrine 0.3 mg/0.3 mL IJ SOAJ injection Inject 0.3 mLs (0.3 mg total) into the muscle once. Use as needed for bee  string.  Ok to substitute generic device 04/11/15   Copland, Gwenlyn Found, MD  mupirocin ointment (BACTROBAN) 2 % Apply to lesion twice a day 04/11/15   Copland, Gwenlyn Found, MD  oxyCODONE-acetaminophen (ROXICET) 5-325 MG per tablet Take 1 tablet by mouth every 4 (four) hours as needed for severe pain. Patient not taking: Reported on 08/23/2018 11/30/14   Dairl Ponder, MD    Family History Family History  Problem Relation Age of Onset   Hypertension Mother    Diabetes Mother     Social History Social History   Tobacco Use   Smoking status: Former Smoker    Packs/day: 0.50    Types: Cigarettes    Last attempt to quit: 02/04/2016    Years since quitting: 2.5   Smokeless tobacco: Never Used  Substance Use Topics   Alcohol use: No    Alcohol/week: 0.0 standard drinks   Drug use: Yes    Types: Marijuana     Allergies   Bee venom   Review of Systems As per HPI   Physical Exam Triage Vital Signs ED Triage Vitals [08/23/18 1340]  Enc Vitals Group     BP (!) 142/87     Pulse Rate 66     Resp 18     Temp 98.5 F (36.9 C)     Temp Source Oral     SpO2 100 %     Weight      Height  Head Circumference      Peak Flow      Pain Score 4     Pain Loc      Pain Edu?      Excl. in GC?    No data found.  Updated Vital Signs BP (!) 142/87 (BP Location: Right Arm)    Pulse 66    Temp 98.5 F (36.9 C) (Oral)    Resp 18    SpO2 100%   Visual Acuity Right Eye Distance:   Left Eye Distance:   Bilateral Distance:    Right Eye Near:   Left Eye Near:    Bilateral Near:     Physical Exam Constitutional:      General: He is not in acute distress. HENT:     Head: Normocephalic and atraumatic.  Eyes:     General: No scleral icterus.    Pupils: Pupils are equal, round, and reactive to light.  Cardiovascular:     Rate and Rhythm: Normal rate.  Pulmonary:     Effort: Pulmonary effort is normal.  Abdominal:     General: Abdomen is flat. Bowel sounds are normal.  There is no distension.     Palpations: Abdomen is soft.     Tenderness: There is no abdominal tenderness. There is no right CVA tenderness, left CVA tenderness or guarding.  Skin:    Coloration: Skin is not jaundiced or pale.  Neurological:     Mental Status: He is alert and oriented to person, place, and time.      UC Treatments / Results  Labs (all labs ordered are listed, but only abnormal results are displayed) Labs Reviewed  RPR  HIV ANTIBODY (ROUTINE TESTING W REFLEX)  POCT URINALYSIS DIP (DEVICE)  URINE CYTOLOGY ANCILLARY ONLY    EKG None  Radiology No results found.  Procedures Procedures (including critical care time)  Medications Ordered in UC Medications - No data to display  Initial Impression / Assessment and Plan / UC Course  I have reviewed the triage vital signs and the nursing notes.  Pertinent labs & imaging results that were available during my care of the patient were reviewed by me and considered in my medical decision making (see chart for details).     38 year old male without significant medical history presenting for single episode of gross hematuria.  POCT urinalysis negative for hemoglobin.  Provided reassurance, patient will continue to monitor symptoms.  STD screening done in office today, will call patient and treat if indicated.  Patient verbalized understanding of plan. Final Clinical Impressions(s) / UC Diagnoses   Final diagnoses:  Unprotected sex     Discharge Instructions     Please monitor your symptoms to discuss with your PCP. We will call you with the results of your lab work and treat if indicated. Please return if you develop abdominal, pelvic, low back pain, discharge, fever.    ED Prescriptions    None     Controlled Substance Prescriptions [place] Controlled Substance Registry consulted? Not applicable   Hall-Potvin, [place], PA-C 08/23/18 1429

## 2018-08-24 LAB — URINE CYTOLOGY ANCILLARY ONLY
Chlamydia: POSITIVE — AB
Neisseria Gonorrhea: NEGATIVE
Trichomonas: NEGATIVE

## 2018-08-24 LAB — RPR: RPR Ser Ql: NONREACTIVE

## 2018-08-24 LAB — HIV ANTIBODY (ROUTINE TESTING W REFLEX): HIV Screen 4th Generation wRfx: NONREACTIVE

## 2018-08-26 ENCOUNTER — Telehealth (HOSPITAL_COMMUNITY): Payer: Self-pay | Admitting: Emergency Medicine

## 2018-08-26 MED ORDER — AZITHROMYCIN 250 MG PO TABS: 1000.0000 mg | ORAL_TABLET | Freq: Once | ORAL | 0 refills | Status: AC

## 2018-08-26 NOTE — Telephone Encounter (Signed)
Chlamydia is positive.  Rx po zithromax 1g #1 dose no refills was sent to the pharmacy of record.  Pt needs education to please refrain from sexual intercourse for 7 days to give the medicine time to work, sexual partners need to be notified and tested/treated.  Condoms may reduce risk of reinfection.  Recheck or followup with PCP for further evaluation if symptoms are not improving.   [facility] notified.  Attempted call x 1 no answer and LVMM

## 2018-08-30 ENCOUNTER — Other Ambulatory Visit: Payer: Self-pay

## 2018-08-30 ENCOUNTER — Ambulatory Visit (HOSPITAL_COMMUNITY)
Admission: EM | Admit: 2018-08-30 | Discharge: 2018-08-30 | Disposition: A | Discharge status: Home / Self Care | Payer: Self-pay | Attending: Family Medicine | Admitting: Family Medicine

## 2018-08-30 ENCOUNTER — Telehealth (HOSPITAL_COMMUNITY): Payer: Self-pay | Admitting: Emergency Medicine

## 2018-08-30 DIAGNOSIS — A749 Chlamydial infection, unspecified: Secondary | ICD-10-CM

## 2018-08-30 MED ORDER — AZITHROMYCIN 250 MG PO TABS: 1000.0000 mg | ORAL_TABLET | Freq: Once | ORAL | 0 refills | Status: AC

## 2018-08-30 MED ORDER — AZITHROMYCIN 250 MG PO TABS
1000.0000 mg | ORAL_TABLET | Freq: Once | ORAL | Status: AC
  Administered 2018-08-30: 1000 mg via ORAL

## 2018-08-30 MED ORDER — AZITHROMYCIN 250 MG PO TABS
ORAL_TABLET | ORAL | Status: AC
  Filled 2018-08-30: qty 4

## 2018-08-30 NOTE — Telephone Encounter (Signed)
Patient contacted and made aware of all results, all questions answered.   

## 2018-08-30 NOTE — ED Notes (Signed)
Pt here for nurse visit only, treatment for stds. No need to be seen by provider.

## 2019-02-08 ENCOUNTER — Ambulatory Visit (HOSPITAL_COMMUNITY)
Admission: EM | Admit: 2019-02-08 | Discharge: 2019-02-08 | Disposition: A | Discharge status: Home / Self Care | Payer: Self-pay | Attending: Family Medicine | Admitting: Family Medicine

## 2019-02-08 ENCOUNTER — Encounter (HOSPITAL_COMMUNITY): Payer: Self-pay

## 2019-02-08 ENCOUNTER — Other Ambulatory Visit: Payer: Self-pay

## 2019-02-08 DIAGNOSIS — L03213 Periorbital cellulitis: Secondary | ICD-10-CM | POA: Insufficient documentation

## 2019-02-08 DIAGNOSIS — Z202 Contact with and (suspected) exposure to infections with a predominantly sexual mode of transmission: Secondary | ICD-10-CM | POA: Insufficient documentation

## 2019-02-08 MED ORDER — AZITHROMYCIN 250 MG PO TABS
ORAL_TABLET | ORAL | Status: AC
  Filled 2019-02-08: qty 4

## 2019-02-08 MED ORDER — LIDOCAINE HCL (PF) 1 % IJ SOLN
INTRAMUSCULAR | Status: AC
  Filled 2019-02-08: qty 2

## 2019-02-08 MED ORDER — CEFTRIAXONE SODIUM 250 MG IJ SOLR
250.0000 mg | Freq: Once | INTRAMUSCULAR | Status: AC
  Administered 2019-02-08: 12:00:00 250 mg via INTRAMUSCULAR

## 2019-02-08 MED ORDER — AZITHROMYCIN 250 MG PO TABS
1000.0000 mg | ORAL_TABLET | Freq: Once | ORAL | Status: AC
  Administered 2019-02-08: 12:00:00 1000 mg via ORAL

## 2019-02-08 MED ORDER — CEFDINIR 300 MG PO CAPS: 300.0000 mg | ORAL_CAPSULE | Freq: Two times a day (BID) | ORAL | 0 refills | Status: DC

## 2019-02-08 MED ORDER — CEFTRIAXONE SODIUM 250 MG IJ SOLR
INTRAMUSCULAR | Status: AC
  Filled 2019-02-08: qty 250

## 2019-02-08 NOTE — ED Notes (Signed)
Patient started talking about hiv testing, notified provider.

## 2019-02-08 NOTE — ED Triage Notes (Signed)
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  Pt also presents for std testing with no symptoms.

## 2019-02-08 NOTE — Discharge Instructions (Signed)

## 2019-02-08 NOTE — ED Provider Notes (Signed)
[facility]   [id number] 02/08/19 Arrival Time: 1014  ASSESSMENT & PLAN:  1. Preseptal cellulitis of right eye   2. Possible exposure to STD     No signs of orbital cellulitis. No urgent facial imaging indicated at this time. Discussed.  Meds ordered this encounter  Medications  . azithromycin (ZITHROMAX) tablet 1,000 mg  . cefTRIAXone (ROCEPHIN) injection 250 mg  . cefdinir (OMNICEF) 300 MG capsule    Sig: Take 1 capsule (300 mg total) by mouth 2 (two) times daily.    Dispense:  14 capsule    Refill:  0   To begin PO antibiotic today.  Will f/u here in 24-48 hours for recheck, sooner if he feels symptoms are worsening. Work note provided.   Discharge Instructions     You have been given the following medications today for treatment of suspected gonorrhea and/or chlamydia:  cefTRIAXone (ROCEPHIN) injection 250 mg azithromycin (ZITHROMAX) tablet 1,000 mg  Even though we have treated you today, we have sent testing for sexually transmitted infections. We will notify you of any positive results once they are received. If required, we will prescribe any medications you might need.  Please refrain from all sexual activity for at least the next seven days.      Reviewed expectations re: course of current medical issues. Questions answered. Outlined signs and symptoms indicating need for more acute intervention. Patient verbalized understanding. After Visit Summary given.   SUBJECTIVE:  Andrew [name] is a 38 y.o. male who presents with complaint of fairly persistent R eye discomfort after rubbing his eye. Unsure if something got into eye; works in dusty environment. Onset gradual, first noted earlier yesterday. Slight watery drainage at onset; none currently. Does feel R upper eyelid is swollen and tender today. Afebrile. Eye injury: no. Visual changes: no. Contact lens use: no. Recent illness: no. Self treatment: none reported. No diplopia reported.   Also requests STI testing. Questions sexual partner "with something". He reports no symptoms but does worry about gonorrhea/chlamydia. Requests empiric treatment. No abdominal or pelvic pain. Normal PO intake without n/v. Ambulatory without difficulty. No urinary symptoms.  ROS: As per HPI. All other systems negative.   OBJECTIVE:  Vitals:   02/08/19 1052  BP: 113/83  Pulse: 77  Resp: 18  Temp: 99.5 F (37.5 C)  TempSrc: Oral  SpO2: 97%    General appearance: alert; no distress HEENT: [place]; AT; PERRLA; EOMI without reported pain; slight erythema and swelling of R upper eyelid extending to eyebrow; warm to touch; without proptosis; able to keep R eye open without difficulty; fluorescein without corneal uptake; no FB appreciated OD: without reported pain; without conjunctival injection; without drainage; without corneal opacities; without limbal flush Neck: supple without LAD Lungs: clear to auscultation bilaterally; unlabored respirations Heart: regular rate and rhythm Skin: warm and dry Psychological: alert and cooperative; normal mood and affect   Allergies  Allergen Reactions  . Bee Venom Swelling    Past Medical History:  Diagnosis Date  . Allergy    bee sting  . Broken finger 2001  . Staph skin infection 2001   need surgical debridment and IV antibiotics    Social History   Socioeconomic History  . Marital status: Single    Spouse name: Not on file  . Number of children: Not on file  . Years of education: Not on file  . Highest education level: Not on file  Occupational History  . Not on file  Social Needs  .  Financial resource strain: Not on file  . Food insecurity    Worry: Not on file    Inability: Not on file  . Transportation needs    Medical: Not on file    Non-medical: Not on file  Tobacco Use  . Smoking status: Former Smoker    Packs/day: 0.50    Types: Cigarettes    Quit date: 02/04/2016    Years since quitting: 3.0  . Smokeless tobacco:  Never Used  Substance and Sexual Activity  . Alcohol use: No    Alcohol/week: 0.0 standard drinks  . Drug use: Yes    Types: Marijuana  . Sexual activity: Not on file  Lifestyle  . Physical activity    Days per week: Not on file    Minutes per session: Not on file  . Stress: Not on file  Relationships  . Social Herbalist on phone: Not on file    Gets together: Not on file    Attends religious service: Not on file    Active member of club or organization: Not on file    Attends meetings of clubs or organizations: Not on file    Relationship status: Not on file  . Intimate partner violence    Fear of current or ex partner: Not on file    Emotionally abused: Not on file    Physically abused: Not on file    Forced sexual activity: Not on file  Other Topics Concern  . Not on file  Social History Narrative  . Not on file   Family History  Problem Relation Age of Onset  . Hypertension Mother   . Diabetes Mother    Past Surgical History:  Procedure Laterality Date  . NERVE, TENDON AND ARTERY REPAIR Left 11/29/2014   Procedure:  REPAIR OF MEDIAN AND ULNA NERVE, REPAIR OF ULNA  ARTERY, REPAIR OF FLEXOR CARPAL ULNA TENDON ;  Surgeon: Charlotte Crumb, MD;  Location: [place];  Service: Orthopedics;  Laterality: Left;     Vanessa Kick, MD 02/09/19 1005

## 2019-02-09 LAB — CYTOLOGY, (ORAL, ANAL, URETHRAL) ANCILLARY ONLY
Chlamydia: NEGATIVE
Chlamydia: NEGATIVE
Neisseria Gonorrhea: NEGATIVE
Neisseria Gonorrhea: NEGATIVE
Trichomonas: NEGATIVE
Trichomonas: NEGATIVE

## 2019-02-09 LAB — RPR: RPR Ser Ql: NONREACTIVE

## 2019-02-09 LAB — HIV ANTIBODY (ROUTINE TESTING W REFLEX): HIV Screen 4th Generation wRfx: NONREACTIVE — AB

## 2020-05-03 ENCOUNTER — Ambulatory Visit (HOSPITAL_COMMUNITY)
Admission: EM | Admit: 2020-05-03 | Discharge: 2020-05-03 | Disposition: A | Discharge status: Home / Self Care | Payer: BLUE CROSS/BLUE SHIELD

## 2020-05-03 ENCOUNTER — Other Ambulatory Visit: Payer: Self-pay

## 2020-05-03 ENCOUNTER — Encounter (HOSPITAL_COMMUNITY): Payer: Self-pay

## 2020-05-03 DIAGNOSIS — Z760 Encounter for issue of repeat prescription: Secondary | ICD-10-CM

## 2020-05-03 HISTORY — DX: Essential (primary) hypertension: I10

## 2020-05-03 MED ORDER — LISINOPRIL 5 MG PO TABS: 5.0000 mg | ORAL_TABLET | Freq: Every day | ORAL | 1 refills | Status: DC

## 2020-05-03 NOTE — ED Provider Notes (Signed)
[facility]    CSN: [id number] Arrival date & time: 05/03/20  1428      History   Chief Complaint Chief Complaint  Patient presents with  . Medication Refill    HPI Andrew [name] is a 40 y.o. male.   Patient presents for medication refill for BP. Taking as prescribed.  Recently released from prison. Looking for PCP. No headaches, no changes in vision, no lightheadedness.   Past Medical History:  Diagnosis Date  . Allergy    bee sting  . Broken finger 2001  . Hypertension   . Staph skin infection 2001   need surgical debridment and IV antibiotics     There are no problems to display for this patient.   Past Surgical History:  Procedure Laterality Date  . NERVE, TENDON AND ARTERY REPAIR Left 11/29/2014   Procedure:  REPAIR OF MEDIAN AND ULNA NERVE, REPAIR OF ULNA  ARTERY, REPAIR OF FLEXOR CARPAL ULNA TENDON ;  Surgeon: Dairl Ponder, MD;  [facility];  Service: Orthopedics;  Laterality: Left;       Home Medications    Prior to Admission medications   Medication Sig Start Date End Date Taking? Authorizing Provider  cefdinir (OMNICEF) 300 MG capsule Take 1 capsule (300 mg total) by mouth 2 (two) times daily. 02/08/19   Mardella Layman, MD  EPINEPHrine (EPIPEN JR) 0.15 MG/0.3ML injection Inject into the muscle.    [provider]  EPINEPHrine 0.3 mg/0.3 mL IJ SOAJ injection Inject 0.3 mLs (0.3 mg total) into the muscle once. Use as needed for bee string.  Ok to substitute generic device 04/11/15   Copland, Gwenlyn Found, MD  lisinopril (ZESTRIL) 5 MG tablet Take 1 tablet (5 mg total) by mouth daily. 05/03/20   Valinda Hoar, NP  mupirocin ointment (BACTROBAN) 2 % Apply to lesion twice a day 04/11/15   Copland, Gwenlyn Found, MD  oxyCODONE-acetaminophen (ROXICET) 5-325 MG per tablet Take 1 tablet by mouth every 4 (four) hours as needed for severe pain. Patient not taking: Reported on 08/23/2018 11/30/14   Dairl Ponder, MD    Family History Family  History  Problem Relation Age of Onset  . Hypertension Mother   . Diabetes Mother     Social History Social History   Tobacco Use  . Smoking status: Former Smoker    Packs/day: 0.50    Types: Cigarettes    Quit date: 02/04/2016    Years since quitting: 4.2  . Smokeless tobacco: Never Used  Substance Use Topics  . Alcohol use: No    Alcohol/week: 0.0 standard drinks  . Drug use: Yes    Types: Marijuana     Allergies   Bee venom   Review of Systems Review of Systems  Constitutional: Negative.   HENT: Negative.   Eyes: Negative.   Respiratory: Negative.   Cardiovascular: Negative.   Neurological: Negative.   Psychiatric/Behavioral: Negative.      Physical Exam Triage Vital Signs ED Triage Vitals  Enc Vitals Group     BP 05/03/20 1445 134/86     Pulse Rate 05/03/20 1445 90     Resp 05/03/20 1445 16     Temp --      Temp src --      SpO2 05/03/20 1445 99 %     Weight 05/03/20 1446 185 lb (83.9 kg)     Height --      Head Circumference --      Peak Flow --  Pain Score 05/03/20 1443 0     Pain Loc --      Pain Edu? --      Excl. in GC? --    No data found.  Updated Vital Signs BP 134/86 (BP Location: Right Arm)   Pulse 90   Resp 16   Wt 185 lb (83.9 kg)   SpO2 99%   BMI 25.80 kg/m   Visual Acuity Right Eye Distance:   Left Eye Distance:   Bilateral Distance:    Right Eye Near:   Left Eye Near:    Bilateral Near:     Physical Exam Exam conducted with a chaperone present.  Constitutional:      Appearance: Normal appearance. He is normal weight.  HENT:     Head: Normocephalic.  Eyes:     Extraocular Movements: Extraocular movements intact.  Cardiovascular:     Rate and Rhythm: Normal rate and regular rhythm.     Pulses: Normal pulses.     Heart sounds: Normal heart sounds.  Pulmonary:     Effort: Pulmonary effort is normal.     Breath sounds: Normal breath sounds.  Musculoskeletal:        General: Normal range of motion.      Cervical back: Normal range of motion.  Skin:    General: Skin is warm and dry.  Neurological:     General: No focal deficit present.     Mental Status: He is alert and oriented to person, place, and time. Mental status is at baseline.  Psychiatric:        Mood and Affect: Mood normal.        Behavior: Behavior normal.        Thought Content: Thought content normal.        Judgment: Judgment normal.      UC Treatments / Results  Labs (all labs ordered are listed, but only abnormal results are displayed) Labs Reviewed - No data to display  EKG   Radiology No results found.  Procedures Procedures (including critical care time)  Medications Ordered in UC Medications - No data to display  Initial Impression / Assessment and Plan / UC Course  I have reviewed the triage vital signs and the nursing notes.  Pertinent labs & imaging results that were available during my care of the patient were reviewed by me and considered in my medical decision making (see chart for details).  Encounter for medication refill for BP  1. Lisinopril 5 mg daily refilled for two months 2. PCP assistance referral made 3. Patient taking [place] circulatory support. Ingredients checked for medication interaction. None noted. Given education to watch for hypotensive effects and to follow up once PCP established. Final Clinical Impressions(s) / UC Diagnoses   Final diagnoses:  Encounter for medication refill     Discharge Instructions     Take lisinopril one time daily  Primary Care Assistance will call you to help you find provider for long term management    ED Prescriptions    Medication Sig Dispense Auth. Provider   lisinopril (ZESTRIL) 5 MG tablet Take 1 tablet (5 mg total) by mouth daily. 30 tablet Valinda Hoar, NP     PDMP not reviewed this encounter.   Valinda Hoar, NP 05/03/20 1549

## 2020-05-03 NOTE — ED Triage Notes (Signed)
Patient presents to Urgent Care for a BP medication refill. He takes Lisinipril 5 mg and does not have a pcp.

## 2020-05-03 NOTE — Discharge Instructions (Signed)
Take lisinopril one time daily  Primary Care Assistance will call you to help you find provider for long term management

## 2020-07-06 ENCOUNTER — Emergency Department (INDEPENDENT_AMBULATORY_CARE_PROVIDER_SITE_OTHER)
Admission: EM | Admit: 2020-07-06 | Discharge: 2020-07-06 | Disposition: A | Discharge status: Home / Self Care | Payer: PRIVATE HEALTH INSURANCE | Source: Home / Self Care | Attending: Emergency Medicine | Admitting: Emergency Medicine

## 2020-07-06 ENCOUNTER — Other Ambulatory Visit: Payer: Self-pay

## 2020-07-06 ENCOUNTER — Encounter: Payer: Self-pay | Admitting: Emergency Medicine

## 2020-07-06 DIAGNOSIS — I10 Essential (primary) hypertension: Secondary | ICD-10-CM | POA: Diagnosis not present

## 2020-07-06 DIAGNOSIS — Z760 Encounter for issue of repeat prescription: Secondary | ICD-10-CM

## 2020-07-06 MED ORDER — LISINOPRIL 5 MG PO TABS: 5.0000 mg | ORAL_TABLET | Freq: Every day | ORAL | 1 refills | Status: DC

## 2020-07-06 NOTE — Discharge Instructions (Addendum)
You may be able to get off the blood pressure medicine with appropriate diet, exercise, and weight loss.  In the meantime, keep taking it.  I am glad that you came in before you ran out.  Below is a list of primary care practices who are taking new patients for you to follow-up with.  [facility] internal medicine clinic Ground Floor - [facility], [address] [place], [place], [place] [address] [phone]  [facility] Primary Care at [facility] [address] [place], [place] [address] [phone]  [facility] [address] [place], [place] [address] [phone]  [facility] [phone] [address] [place] [address]  [facility]: [address] [place] [address]  [phone]  [facility]: [address] [place] [address]   [phone]  [facility]: [address] [place] [address]  [phone]  [facility] : [address] [place] [address] [phone]  [facility] Primary Care: [address] [place] [address] [phone]  [facility]: [address] [place] [address] [phone]  Dr. Oneal Grout [address] [facility] [place] [address]  [phone]  Dr. Jackie Plum, [facility]. [address] [place], [place] [address]  [phone]  Go to www.goodrx.com to look up your medications. This will give you a list of where you can find your prescriptions at the most affordable prices. Or ask the pharmacist what the cash price is, or if they have any other discount programs available to help make your medication more affordable. This can be less expensive than what you would pay with insurance.

## 2020-07-06 NOTE — ED Provider Notes (Signed)
HPI  SUBJECTIVE:  Andrew [name] is a 40 y.o. male who presents for a medication refill for his hypertension.  He takes lisinopril 5 mg daily.  States that this controls his blood pressure well.  He has not had any side effects, and states that he is taking it as directed.  He has not yet established care with a primary care physician.  He currently has no complaints.  Past medical history of hypertension.   Past Medical History:  Diagnosis Date  . Allergy    bee sting  . Broken finger 2001  . Hypertension   . Staph skin infection 2001   need surgical debridment and IV antibiotics     Past Surgical History:  Procedure Laterality Date  . NERVE, TENDON AND ARTERY REPAIR Left 11/29/2014   Procedure:  REPAIR OF MEDIAN AND ULNA NERVE, REPAIR OF ULNA  ARTERY, REPAIR OF FLEXOR CARPAL ULNA TENDON ;  Surgeon: Dairl Ponder, MD;  [facility];  Service: Orthopedics;  Laterality: Left;    Family History  Problem Relation Age of Onset  . Hypertension Mother   . Diabetes Mother     Social History   Tobacco Use  . Smoking status: Former Smoker    Packs/day: 0.50    Types: Cigarettes    Quit date: 02/04/2016    Years since quitting: 4.4  . Smokeless tobacco: Never Used  Substance Use Topics  . Alcohol use: No    Alcohol/week: 0.0 standard drinks  . Drug use: Yes    Types: Marijuana    No current facility-administered medications for this encounter.  Current Outpatient Medications:  .  EPINEPHrine (EPIPEN JR) 0.15 MG/0.3ML injection, Inject into the muscle., Disp: , Rfl:  .  EPINEPHrine 0.3 mg/0.3 mL IJ SOAJ injection, Inject 0.3 mLs (0.3 mg total) into the muscle once. Use as needed for bee string.  Ok to substitute generic device, Disp: 2 Device, Rfl: 1 .  lisinopril (ZESTRIL) 5 MG tablet, Take 1 tablet (5 mg total) by mouth daily., Disp: 30 tablet, Rfl: 1  Allergies  Allergen Reactions  . Bee Venom Swelling     ROS  As noted in HPI.   Physical Exam  BP 134/83  (BP Location: Left Arm)   Pulse 72   SpO2 96%   Constitutional: Well developed, well nourished, no acute distress Eyes:  EOMI, conjunctiva normal bilaterally HENT: Normocephalic, atraumatic,mucus membranes moist Respiratory: Normal inspiratory effort, lungs clear bilaterally Cardiovascular: Normal rate regular rhythm no murmurs rubs or gallop GI: nondistended skin: No rash, skin intact Musculoskeletal: no deformities Neurologic: Alert & oriented x 3, no focal neuro deficits Psychiatric: Speech and behavior appropriate   ED Course   Medications - No data to display  No orders of the defined types were placed in this encounter.   No results found for this or any previous visit (from the past 24 hour(s)). No results found.  ED Clinical Impression  1. Essential hypertension   2. Medication refill      ED Assessment/Plan  Patient normotensive today.  Has not had any problems with the lisinopril.  Will refill this.  Providing 1 months worth with a refill.  Will give patient a primary care list in [place], as he lives in [place] and will order assistance in finding PMD.   Meds ordered this encounter  Medications  . lisinopril (ZESTRIL) 5 MG tablet    Sig: Take 1 tablet (5 mg total) by mouth daily.    Dispense:  30  tablet    Refill:  1      *This clinic note was created using Scientist, clinical (histocompatibility and immunogenetics). Therefore, there may be occasional mistakes despite careful proofreading.  ?    Domenick Gong, MD 07/06/20 [phone]

## 2020-07-06 NOTE — ED Triage Notes (Signed)
Patient is needing a refill on his blood pressure medication.  Patient just started new job and had refills, now he's down to his last tablet.  Patient is currently on Lisinopril 5mg  daily.

## 2020-08-08 ENCOUNTER — Emergency Department (INDEPENDENT_AMBULATORY_CARE_PROVIDER_SITE_OTHER)
Admission: EM | Admit: 2020-08-08 | Discharge: 2020-08-08 | Disposition: A | Discharge status: Home / Self Care | Payer: PRIVATE HEALTH INSURANCE | Source: Home / Self Care

## 2020-08-08 ENCOUNTER — Other Ambulatory Visit: Payer: Self-pay

## 2020-08-08 DIAGNOSIS — I10 Essential (primary) hypertension: Secondary | ICD-10-CM

## 2020-08-08 MED ORDER — LISINOPRIL 5 MG PO TABS: 5.0000 mg | ORAL_TABLET | Freq: Every day | ORAL | 0 refills | Status: DC

## 2020-08-08 NOTE — Discharge Instructions (Signed)
Advised/encouraged patient to check blood pressure once daily prior to breakfast for the next 10 days and to log measurements for new PCP to evaluate daily blood pressure trends.

## 2020-08-08 NOTE — ED Provider Notes (Signed)
[name]    CSN: [id number] Arrival date & time: 08/08/20  0916      History   Chief Complaint Chief Complaint  Patient presents with  . Medication Refill    HPI Andrew [name] is a 40 y.o. male.   HPI 41 year old male presents for medication refill.  Request refill of lisinopril 5 mg.  Patient reports he is currently not followed by PCP but would like to be established with PCP.  Denies any other concerns or complaints today.  Past Medical History:  Diagnosis Date  . Allergy    bee sting  . Broken finger 2001  . Hypertension   . Staph skin infection 2001   need surgical debridment and IV antibiotics     There are no problems to display for this patient.   Past Surgical History:  Procedure Laterality Date  . NERVE, TENDON AND ARTERY REPAIR Left 11/29/2014   Procedure:  REPAIR OF MEDIAN AND ULNA NERVE, REPAIR OF ULNA  ARTERY, REPAIR OF FLEXOR CARPAL ULNA TENDON ;  Surgeon: Dairl Ponder, MD;  Location: [facility] OR;  Service: Orthopedics;  Laterality: Left;       Home Medications    Prior to Admission medications   Medication Sig Start Date End Date Taking? Authorizing Provider  lisinopril (ZESTRIL) 5 MG tablet Take 1 tablet (5 mg total) by mouth daily. 08/08/20 09/07/20 Yes Trevor Iha, FNP  EPINEPHrine (EPIPEN JR) 0.15 MG/0.3ML injection Inject into the muscle.    [provider]  EPINEPHrine 0.3 mg/0.3 mL IJ SOAJ injection Inject 0.3 mLs (0.3 mg total) into the muscle once. Use as needed for bee string.  Ok to substitute generic device 04/11/15   Copland, Gwenlyn Found, MD    Family History Family History  Problem Relation Age of Onset  . Hypertension Mother   . Diabetes Mother     Social History Social History   Tobacco Use  . Smoking status: Former Smoker    Packs/day: 0.50    Types: Cigarettes    Quit date: 02/04/2016    Years since quitting: 4.5  . Smokeless tobacco: Never Used  Substance Use Topics  . Alcohol use: No     Alcohol/week: 0.0 standard drinks  . Drug use: Yes    Types: Marijuana     Allergies   Bee venom   Review of Systems Review of Systems  Constitutional: Negative.   HENT: Negative.   Eyes: Negative.   Respiratory: Negative.   Cardiovascular: Negative.   Gastrointestinal: Negative.   Musculoskeletal: Negative.   Skin: Negative.   Neurological: Negative.      Physical Exam Triage Vital Signs ED Triage Vitals [08/08/20 0926]  Enc Vitals Group     BP 134/79     Pulse Rate (!) 59     Resp 18     Temp 97.9 F (36.6 C)     Temp Source Oral     SpO2 97 %     Weight      Height      Head Circumference      Peak Flow      Pain Score 0     Pain Loc      Pain Edu?      Excl. in GC?    No data found.  Updated Vital Signs BP 134/79 (BP Location: Left Arm)   Pulse (!) 59   Temp 97.9 F (36.6 C) (Oral)   Resp 18   SpO2 97%  Physical Exam Vitals and nursing note reviewed.  Constitutional:      General: He is not in acute distress.    Appearance: Normal appearance. He is normal weight. He is not ill-appearing.  HENT:     Head: Normocephalic and atraumatic.     Mouth/Throat:     Mouth: Mucous membranes are moist.     Pharynx: Oropharynx is clear.  Eyes:     Extraocular Movements: Extraocular movements intact.     Conjunctiva/sclera: Conjunctivae normal.     Pupils: Pupils are equal, round, and reactive to light.  Neck:     Vascular: No carotid bruit.     Comments: No JVD Cardiovascular:     Rate and Rhythm: Normal rate and regular rhythm.     Pulses: Normal pulses.     Heart sounds: Normal heart sounds.  Pulmonary:     Effort: Pulmonary effort is normal. No respiratory distress.     Breath sounds: Normal breath sounds. No wheezing, rhonchi or rales.  Musculoskeletal:        General: Normal range of motion.     Cervical back: Normal range of motion and neck supple.  Skin:    General: Skin is warm and dry.  Neurological:     General: No focal  deficit present.     Mental Status: He is alert and oriented to person, place, and time.  Psychiatric:        Mood and Affect: Mood normal.        Behavior: Behavior normal.        Thought Content: Thought content normal.      UC Treatments / Results  Labs (all labs ordered are listed, but only abnormal results are displayed) Labs Reviewed - No data to display  EKG   Radiology No results found.  Procedures Procedures (including critical care time)  Medications Ordered in UC Medications - No data to display  Initial Impression / Assessment and Plan / UC Course  I have reviewed the triage vital signs and the nursing notes.  Pertinent labs & imaging results that were available during my care of the patient were reviewed by me and considered in my medical decision making (see chart for details).     MDM: 1.  Hypertension.  Lisinopril refilled for 1 month, patient set up with [place] PCP prior to discharge today.  Patient discharged home, hemodynamically stable. Final Clinical Impressions(s) / UC Diagnoses   Final diagnoses:  Essential hypertension     Discharge Instructions     Advised/encouraged patient to check blood pressure once daily prior to breakfast for the next 10 days and to log measurements for new PCP to evaluate daily blood pressure trends.    ED Prescriptions    Medication Sig Dispense Auth. Provider   lisinopril (ZESTRIL) 5 MG tablet Take 1 tablet (5 mg total) by mouth daily. 30 tablet Trevor Iha, FNP     PDMP not reviewed this encounter.   Trevor Iha, FNP 08/08/20 1004

## 2020-08-08 NOTE — ED Notes (Signed)
PCP establish care appt set up after triage. Scheduled for June 10th at 830am with Dr Everrett Coombe

## 2020-08-08 NOTE — ED Triage Notes (Signed)
Pt here today for medication refill of his Lisinopril 5mg . Just ran out yesterday. Does not currently have PCP but is interested in setting up an appt today.

## 2020-08-31 ENCOUNTER — Other Ambulatory Visit: Payer: Self-pay

## 2020-08-31 ENCOUNTER — Ambulatory Visit (INDEPENDENT_AMBULATORY_CARE_PROVIDER_SITE_OTHER): Payer: PRIVATE HEALTH INSURANCE | Admitting: Family Medicine

## 2020-08-31 ENCOUNTER — Encounter: Payer: Self-pay | Admitting: Family Medicine

## 2020-08-31 VITALS — BP 117/68 | HR 88 | Temp 98.2°F | Ht 70.28 in | Wt 186.7 lb

## 2020-08-31 DIAGNOSIS — Z113 Encounter for screening for infections with a predominantly sexual mode of transmission: Secondary | ICD-10-CM | POA: Diagnosis not present

## 2020-08-31 DIAGNOSIS — F17210 Nicotine dependence, cigarettes, uncomplicated: Secondary | ICD-10-CM

## 2020-08-31 DIAGNOSIS — F17200 Nicotine dependence, unspecified, uncomplicated: Secondary | ICD-10-CM | POA: Insufficient documentation

## 2020-08-31 DIAGNOSIS — I10 Essential (primary) hypertension: Secondary | ICD-10-CM

## 2020-08-31 MED ORDER — LISINOPRIL 5 MG PO TABS: 5.0000 mg | ORAL_TABLET | Freq: Every day | ORAL | 1 refills | Status: DC

## 2020-08-31 MED ORDER — VARENICLINE TARTRATE 0.5 MG X 11 & 1 MG X 42 PO MISC: ORAL | 0 refills | Status: DC

## 2020-08-31 MED ORDER — VARENICLINE TARTRATE 1 MG PO TABS: 1.0000 mg | ORAL_TABLET | Freq: Two times a day (BID) | ORAL | 2 refills | Status: DC

## 2020-08-31 NOTE — Patient Instructions (Signed)
Managing the Challenge of Quitting Smoking Quitting smoking is a physical and mental challenge. You will face cravings, withdrawal symptoms, and temptation. Before quitting, work with your health care provider to make a plan that can help you manage quitting. Preparation canhelp you quit and keep you from giving in. How to manage lifestyle changes Managing stress Stress can make you want to smoke, and wanting to smoke may cause stress. It is important to find ways to manage your stress. You might try some of the following: Practice relaxation techniques. Breathe slowly and deeply, in through your nose and out through your mouth. Listen to music. Soak in a bath or take a shower. Imagine a peaceful place or vacation. Get some support. Talk with family or friends about your stress. Join a support group. Talk with a counselor or therapist. Get some physical activity. Go for a walk, run, or bike ride. Play a favorite sport. Practice yoga.  Medicines Talk with your health care provider about medicines that might help you dealwith cravings and make quitting easier for you. Relationships Social situations can be difficult when you are quitting smoking. To manage this, you can: Avoid parties and other social situations where people might be smoking. Avoid alcohol. Leave right away if you have the urge to smoke. Explain to your family and friends that you are quitting smoking. Ask for support and let them know you might be a bit grumpy. Plan activities where smoking is not an option. General instructions Be aware that many people gain weight after they quit smoking. However, not everyone does. To keep from gaining weight, have a plan in place before you quit and stick to the plan after you quit. Your plan should include: Having healthy snacks. When you have a craving, it may help to: Eat popcorn, carrots, celery, or other cut vegetables. Chew sugar-free gum. Changing how you eat. Eat small  portion sizes at meals. Eat 4-6 small meals throughout the day instead of 1-2 large meals a day. Be mindful when you eat. Do not watch television or do other things that might distract you as you eat. Exercising regularly. Make time to exercise each day. If you do not have time for a long workout, do short bouts of exercise for 5-10 minutes several times a day. Do some form of strengthening exercise, such as weight lifting. Do some exercise that gets your heart beating and causes you to breathe deeply, such as walking fast, running, swimming, or biking. This is very important. Drinking plenty of water or other low-calorie or no-calorie drinks. Drink 6-8 glasses of water daily.  How to recognize withdrawal symptoms Your body and mind may experience discomfort as you try to get used to not having nicotine in your system. These effects are called withdrawal symptoms. They may include: Feeling hungrier than normal. Having trouble concentrating. Feeling irritable or restless. Having trouble sleeping. Feeling depressed. Craving a cigarette. To manage withdrawal symptoms: Avoid places, people, and activities that trigger your cravings. Remember why you want to quit. Get plenty of sleep. Avoid coffee and other caffeinated drinks. These may worsen some of your symptoms. These symptoms may surprise you. But be assured that they are normal to havewhen quitting smoking. How to manage cravings Come up with a plan for how to deal with your cravings. The plan should include the following: A definition of the specific situation you want to deal with. An alternative action you will take. A clear idea for how this action will help. The   name of someone who might help you with this. Cravings usually last for 5-10 minutes. Consider taking the following actions to help you with your plan to deal with cravings: Keep your mouth busy. Chew sugar-free gum. Suck on hard candies or a straw. Brush your  teeth. Keep your hands and body busy. Change to a different activity right away. Squeeze or play with a ball. Do an activity or a hobby, such as making bead jewelry, practicing needlepoint, or working with wood. Mix up your normal routine. Take a short exercise break. Go for a quick walk or run up and down stairs. Focus on doing something kind or helpful for someone else. Call a friend or family member to talk during a craving. Join a support group. Contact a quitline. Where to find support To get help or find a support group: Call the National Cancer Institute's Smoking Quitline: 1-800-QUIT NOW (784-8669) Visit the website of the Substance Abuse and Mental Health Services Administration: www.samhsa.gov Text QUIT to SmokefreeTXT: 478848 Where to find more information Visit these websites to find more information on quitting smoking: National Cancer Institute: www.smokefree.gov American Lung Association: www.lung.org American Cancer Society: www.cancer.org Centers for Disease Control and Prevention: www.cdc.gov American Heart Association: www.heart.org Contact a health care provider if: You want to change your plan for quitting. The medicines you are taking are not helping. Your eating feels out of control or you cannot sleep. Get help right away if: You feel depressed or become very anxious. Summary Quitting smoking is a physical and mental challenge. You will face cravings, withdrawal symptoms, and temptation to smoke again. Preparation can help you as you go through these challenges. Try different techniques to manage stress, handle social situations, and prevent weight gain. You can deal with cravings by keeping your mouth busy (such as by chewing gum), keeping your hands and body busy, calling family or friends, or contacting a quitline for people who want to quit smoking. You can deal with withdrawal symptoms by avoiding places where people smoke, getting plenty of rest, and  avoiding drinks with caffeine. This information is not intended to replace advice given to you by your health care provider. Make sure you discuss any questions you have with your healthcare provider. Document Revised: 12/28/2018 Document Reviewed: 12/28/2018 Elsevier Patient Education  2022 Elsevier Inc.  

## 2020-08-31 NOTE — Progress Notes (Signed)
Andrew [name] - 40 y.o. male MRN [id number]  Date of birth: June 09, 1980  Subjective Chief Complaint  Patient presents with   Establish Care    HPI Andrew [name] is a 40 y.o. male here today for initial visit to establish care.  He has history of HTN.  He has not had a primary care provider in a while and has been getting rx for lisinopril filled at urgent care.  He is doing well with lisinopril at this time.  BP has been well controlled with this.  He is a smoker and is interested in quitting.  He has never tried anything to help with quitting in the past.    He would also like STI testing.  He denies symptoms.   ROS:  A comprehensive ROS was completed and negative except as noted per HPI  Allergies  Allergen Reactions   Bee Venom Anaphylaxis    Past Medical History:  Diagnosis Date   Allergy    bee sting   Broken finger 2001   Hypertension    Staph skin infection 2001   need surgical debridment and IV antibiotics     Past Surgical History:  Procedure Laterality Date   NERVE, TENDON AND ARTERY REPAIR Left 11/29/2014   Procedure:  REPAIR OF MEDIAN AND ULNA NERVE, REPAIR OF ULNA  ARTERY, REPAIR OF FLEXOR CARPAL ULNA TENDON ;  Surgeon: Dairl Ponder, MD;  Location: [facility] OR;  Service: Orthopedics;  Laterality: Left;    Social History   Socioeconomic History   Marital status: Single    Spouse name: Not on file   Number of children: Not on file   Years of education: Not on file   Highest education level: Not on file  Occupational History   Not on file  Tobacco Use   Smoking status: Former    Packs/day: 0.50    Pack years: 0.00    Types: Cigarettes    Quit date: 02/04/2016    Years since quitting: 4.5   Smokeless tobacco: Never  Substance and Sexual Activity   Alcohol use: No    Alcohol/week: 0.0 standard drinks   Drug use: Yes    Types: Marijuana   Sexual activity: Not on file  Other Topics Concern   Not on file  Social History Narrative   Not on file    Social Determinants of Health   Financial Resource Strain: Not on file  Food Insecurity: Not on file  Transportation Needs: Not on file  Physical Activity: Not on file  Stress: Not on file  Social Connections: Not on file    Family History  Problem Relation Age of Onset   Hypertension Mother    Diabetes Mother     Health Maintenance  Topic Date Due   Pneumococcal Vaccine 35-71 Years old (1 - PCV) Never done   COVID-19 Vaccine (3 - Booster for Moderna series) 04/11/2020   INFLUENZA VACCINE  10/22/2020   TETANUS/TDAP  08/22/2029   Zoster Vaccines- Shingrix (1 of 2) 03/28/2030   Hepatitis C Screening  Completed   HIV Screening  Completed   HPV VACCINES  Aged Out     ----------------------------------------------------------------------------------------------------------------------------------------------------------------------------------------------------------------- Physical Exam BP 117/68 (BP Location: Left Arm, Patient Position: Sitting, Cuff Size: Normal)   Pulse 88   Temp 98.2 F (36.8 C)   Ht 5' 10.28" (1.785 m)   Wt 186 lb 11.2 oz (84.7 kg)   SpO2 95%   BMI 26.58 kg/m   Physical Exam Constitutional:  Appearance: Normal appearance.  Eyes:     General: No scleral icterus. Cardiovascular:     Rate and Rhythm: Normal rate and regular rhythm.  Pulmonary:     Effort: Pulmonary effort is normal.     Breath sounds: Normal breath sounds.  Musculoskeletal:     Cervical back: Neck supple.  Neurological:     General: No focal deficit present.     Mental Status: He is alert.  Psychiatric:        Mood and Affect: Mood normal.        Behavior: Behavior normal.    ------------------------------------------------------------------------------------------------------------------------------------------------------------------------------------------------------------------- Assessment and Plan  Essential hypertension Blood pressure is at goal at for age  and co-morbidities.  I recommend continuation of lisinopril 5mg  daily.  Updated labs ordered.  In addition they were instructed to follow a low sodium diet with regular exercise to help to maintain adequate control of blood pressure.    Nicotine dependence Counseled on smoking cessation.  Starting chantix.  Side effects discussed.    Meds ordered this encounter  Medications   varenicline (CHANTIX STARTING MONTH PAK) 0.5 MG X 11 & 1 MG X 42 tablet    Sig: Take one 0.5 mg tablet by mouth once daily for 3 days, then increase to one 0.5 mg tablet twice daily for 4 days, then increase to one 1 mg tablet twice daily.    Dispense:  53 tablet    Refill:  0   varenicline (CHANTIX CONTINUING MONTH PAK) 1 MG tablet    Sig: Take 1 tablet (1 mg total) by mouth 2 (two) times daily.    Dispense:  30 tablet    Refill:  2   lisinopril (ZESTRIL) 5 MG tablet    Sig: Take 1 tablet (5 mg total) by mouth daily.    Dispense:  90 tablet    Refill:  1    Return in about 6 months (around 03/02/2021) for HTN.    This visit occurred during the SARS-CoV-2 public health emergency.  Safety protocols were in place, including screening questions prior to the visit, additional usage of staff PPE, and extensive cleaning of exam room while observing appropriate contact time as indicated for disinfecting solutions.

## 2020-08-31 NOTE — Assessment & Plan Note (Signed)
Blood pressure is at goal at for age and co-morbidities.  I recommend continuation of lisinopril 5mg  daily.  Updated labs ordered.  In addition they were instructed to follow a low sodium diet with regular exercise to help to maintain adequate control of blood pressure.

## 2020-08-31 NOTE — Assessment & Plan Note (Signed)
Counseled on smoking cessation.  Starting chantix.  Side effects discussed.

## 2020-09-03 LAB — HEPATITIS C ANTIBODY
Hepatitis C Ab: NONREACTIVE
SIGNAL TO CUT-OFF: 0.01 (ref <1.00)

## 2020-09-03 LAB — CBC WITH DIFFERENTIAL/PLATELET
Absolute Monocytes: 322 cells/uL (ref 200–950)
Basophils Absolute: 11 cells/uL (ref 0–200)
Basophils Relative: 0.3 %
Eosinophils Absolute: 181 cells/uL (ref 15–500)
Eosinophils Relative: 4.9 %
HCT: 42.7 % (ref 38.5–50.0)
Hemoglobin: 13.8 g/dL (ref 13.2–17.1)
Lymphs Abs: 1495 cells/uL (ref 850–3900)
MCH: 28.6 pg (ref 27.0–33.0)
MCHC: 32.3 g/dL (ref 32.0–36.0)
MCV: 88.4 fL (ref 80.0–100.0)
MPV: 13.7 fL — ABNORMAL HIGH (ref 7.5–12.5)
Monocytes Relative: 8.7 %
Neutro Abs: 1691 cells/uL (ref 1500–7800)
Neutrophils Relative %: 45.7 %
Platelets: 143 10*3/uL (ref 140–400)
RBC: 4.83 10*6/uL (ref 4.20–5.80)
RDW: 13.1 % (ref 11.0–15.0)
Total Lymphocyte: 40.4 %
WBC: 3.7 10*3/uL — ABNORMAL LOW (ref 3.8–10.8)

## 2020-09-03 LAB — BASIC METABOLIC PANEL
BUN: 17 mg/dL (ref 7–25)
CO2: 27 mmol/L (ref 20–32)
Calcium: 9.6 mg/dL (ref 8.6–10.3)
Chloride: 105 mmol/L (ref 98–110)
Creat: 1.18 mg/dL (ref 0.60–1.35)
Glucose, Bld: 88 mg/dL (ref 65–99)
Potassium: 4.3 mmol/L (ref 3.5–5.3)
Sodium: 138 mmol/L (ref 135–146)

## 2020-09-03 LAB — HIV ANTIBODY (ROUTINE TESTING W REFLEX): HIV 1&2 Ab, 4th Generation: NONREACTIVE

## 2020-09-03 LAB — CHLAMYDIA/NEISSERIA GONORRHOEAE RNA,TMA,UROGENTIAL
C. trachomatis RNA, TMA: NOT DETECTED
N. gonorrhoeae RNA, TMA: NOT DETECTED

## 2020-09-03 LAB — RPR: RPR Ser Ql: NONREACTIVE

## 2020-10-16 ENCOUNTER — Emergency Department (INDEPENDENT_AMBULATORY_CARE_PROVIDER_SITE_OTHER)
Admission: EM | Admit: 2020-10-16 | Discharge: 2020-10-16 | Disposition: A | Discharge status: Home / Self Care | Payer: PRIVATE HEALTH INSURANCE | Source: Home / Self Care

## 2020-10-16 ENCOUNTER — Other Ambulatory Visit: Payer: Self-pay

## 2020-10-16 DIAGNOSIS — R52 Pain, unspecified: Secondary | ICD-10-CM

## 2020-10-16 DIAGNOSIS — R112 Nausea with vomiting, unspecified: Secondary | ICD-10-CM

## 2020-10-16 MED ORDER — ONDANSETRON 8 MG PO TBDP: 8.0000 mg | ORAL_TABLET | Freq: Three times a day (TID) | ORAL | 0 refills | Status: DC | PRN

## 2020-10-16 NOTE — Discharge Instructions (Addendum)
Advised patient to adhere to brat/bland diet for the next 2 to 3 days gradually returning to normal diet.  Advised/encouraged  patient to rehydrate with water, Gatorade G2, and/or ginger ale.  Advised patient may use Zofran for nausea/vomiting.  Advised patient we will follow-up with him once COVID-19/flu A&B results are received.  Work note provided prior to discharge.

## 2020-10-16 NOTE — ED Triage Notes (Signed)
Pt seen in UC w/ c/o emesis x1 this morning. Pt has possible covid exposure. Pt states he has been able to drink water throughout today.

## 2020-10-16 NOTE — ED Provider Notes (Signed)
[facility]    CSN: [id number] Arrival date & time: 10/16/20  1950      History   Chief Complaint Chief Complaint  Patient presents with   Emesis    x1    HPI Andrew [name] is a 40 y.o. male.   HPI 40 year-old male presents with a 1 bout of emesis earlier this morning.  Patient reports possible COVID-19 exposure, reports has been able to drink water throughout the day without concern.  Patient request to be tested for COVID-19 this evening.  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 Authorizing Provider  ondansetron (ZOFRAN ODT) 8 MG disintegrating tablet Take 1 tablet (8 mg total) by mouth every 8 (eight) hours as needed for nausea or vomiting. 10/16/20  Yes Trevor Iha, FNP  EPINEPHrine 0.3 mg/0.3 mL IJ SOAJ injection Inject 0.3 mLs (0.3 mg total) into the muscle once. Use as needed for bee string.  Ok to substitute generic device 04/11/15   Copland, Gwenlyn Found, MD  lisinopril (ZESTRIL) 5 MG tablet Take 1 tablet (5 mg total) by mouth daily. 08/31/20 09/30/20  Everrett Coombe, DO  varenicline (CHANTIX CONTINUING MONTH PAK) 1 MG tablet Take 1 tablet (1 mg total) by mouth 2 (two) times daily. 08/31/20   Everrett Coombe, DO  varenicline (CHANTIX STARTING MONTH  PAK) 0.5 MG X 11 & 1 MG X 42 tablet Take one 0.5 mg tablet by mouth once daily for 3 days, then increase to one 0.5 mg tablet twice daily for 4 days, then increase to one 1 mg tablet twice daily. 08/31/20   Everrett Coombe, DO    Family History Family History  Problem Relation Age of Onset   Hypertension Mother    Diabetes Mother     Social History Social History   Tobacco Use   Smoking status: Former    Packs/day: 0.50    Types: Cigarettes    Quit date: 02/04/2016    Years since quitting: 4.7   Smokeless tobacco: Never  Substance Use Topics   Alcohol use: Yes    Comment: socially   Drug use: Yes    Types: Marijuana     Allergies   Bee venom   Review of Systems Review of Systems  Gastrointestinal:  Positive for nausea and vomiting.  Musculoskeletal:        Myalgias x 2-3 days    Physical Exam Triage Vital Signs ED Triage Vitals [10/16/20 2003]  Enc Vitals Group     BP 116/83     Pulse Rate 79     Resp 12     Temp 98.8 F (37.1 C)     Temp Source  Oral     SpO2 97 %     Weight 180 lb (81.6 kg)     Height 5\' 11"  (1.803 m)     Head Circumference      Peak Flow      Pain Score 0     Pain Loc      Pain Edu?      Excl. in [place]?    No data found.  Updated Vital Signs BP 116/83 (BP Location: Right Arm)   Pulse 79   Temp 98.8 F (37.1 C) (Oral)   Resp 12   Ht 5\' 11"  (1.803 m)   Wt 180 lb (81.6 kg)   SpO2 97%   BMI 25.10 kg/m      Physical Exam Vitals and nursing note reviewed.  Constitutional:      General: He is not in acute distress.    Appearance: Normal appearance. He is normal weight. He is not ill-appearing.  HENT:     Head: Normocephalic and atraumatic.     Right Ear: Tympanic membrane, ear canal and external ear normal.     Left Ear: Tympanic membrane, ear canal and external ear normal.     Nose: Nose normal.     Mouth/Throat:     Mouth: Mucous membranes are moist.     Pharynx: Oropharynx is clear.  Eyes:     Extraocular Movements:  Extraocular movements intact.     Conjunctiva/sclera: Conjunctivae normal.     Pupils: Pupils are equal, round, and reactive to light.  Cardiovascular:     Rate and Rhythm: Normal rate and regular rhythm.     Pulses: Normal pulses.     Heart sounds: Normal heart sounds.  Pulmonary:     Effort: Pulmonary effort is normal.     Breath sounds: Normal breath sounds.     Comments: No adventitious breath sounds noted Abdominal:     General: There is no distension.     Palpations: Abdomen is soft. There is no mass.     Tenderness: There is no abdominal tenderness. There is no right CVA tenderness, left CVA tenderness, guarding or rebound.     Comments: Hypoactive bowel sounds x4, no rigidity, no hepatosplenomegaly  Musculoskeletal:        General: Normal range of motion.     Cervical back: Normal range of motion and neck supple. No tenderness.  Lymphadenopathy:     Cervical: No cervical adenopathy.  Skin:    General: Skin is warm and dry.  Neurological:     General: No focal deficit present.     Mental Status: He is alert and oriented to person, place, and time. Mental status is at baseline.  Psychiatric:        Mood and Affect: Mood normal.        Behavior: Behavior normal.     UC Treatments / Results  Labs (all labs ordered are listed, but only abnormal results are displayed) Labs Reviewed  COVID-19, FLU A+B NAA    EKG   Radiology No results found.  Procedures Procedures (including critical care time)  Medications Ordered in UC Medications - No data to display  Initial Impression / Assessment and Plan / UC Course  I have reviewed the triage vital signs and the nursing notes.  Pertinent labs & imaging results that were available during my care of the patient were reviewed by me and considered in my medical decision making (see chart for details).    MDM: 1.  Generalized  body aches-COVID-19/flu A&B ordered, 2.  Intractable vomiting with nausea, unspecified vomiting  type-Rx'd Zofran, advised patient to adhere to brat/bland diet for the next 2 to 3 days gradually returning to normal diet.  Advised/encouraged patient to hydrate with water, Gatorade G2 and or ginger ale. Final Clinical Impressions(s) / UC Diagnoses   Final diagnoses:  Intractable vomiting with nausea, unspecified vomiting type  Generalized body aches     Discharge Instructions      Advised patient to adhere to brat/bland diet for the next 2 to 3 days gradually returning to normal diet.  Advised patient may use Zofran for nausea/vomiting.  Advised patient we will follow-up with him once COVID-19/flu A and B results are received.  Work note provided prior to discharge.     ED Prescriptions     Medication Sig Dispense Auth. Provider   ondansetron (ZOFRAN ODT) 8 MG disintegrating tablet Take 1 tablet (8 mg total) by mouth every 8 (eight) hours as needed for nausea or vomiting. 24 tablet Trevor Iha, FNP      PDMP not reviewed this encounter.   Trevor Iha, FNP 10/16/20 2031

## 2020-10-17 ENCOUNTER — Telehealth: Payer: Self-pay | Admitting: Emergency Medicine

## 2020-10-17 NOTE — Telephone Encounter (Signed)
Patient called looking for his COVID/FLU results that were obtained yesterday.  Advised patient that the results are not back yet.  Patient will follow up in a couple of days for results.

## 2020-10-18 ENCOUNTER — Telehealth: Payer: Self-pay | Admitting: Emergency Medicine

## 2020-10-18 NOTE — Telephone Encounter (Signed)
Call from [name] regarding COVID  test results- RN reviewed w/ pt how to set up My Chart- also explained that results were picked up on the morning of the 27th. [facility] had already picked up labs on 7/26 for the evening before patient was seen.

## 2020-10-19 ENCOUNTER — Telehealth: Payer: Self-pay

## 2020-10-19 LAB — COVID-19, FLU A+B NAA
Influenza A, NAA: NOT DETECTED
Influenza B, NAA: NOT DETECTED
SARS-CoV-2, NAA: NOT DETECTED

## 2020-10-19 NOTE — Telephone Encounter (Unsigned)
TC from pt to check on COVID results; informed him that test was negative.

## 2020-11-13 ENCOUNTER — Ambulatory Visit: Payer: PRIVATE HEALTH INSURANCE | Admitting: Family Medicine

## 2021-01-31 ENCOUNTER — Other Ambulatory Visit: Payer: Self-pay | Admitting: Family Medicine

## 2021-01-31 DIAGNOSIS — I10 Essential (primary) hypertension: Secondary | ICD-10-CM

## 2021-02-01 NOTE — Telephone Encounter (Signed)
Patient is having abdominal pain and wanted to be seen sooner than Dr Ashley Royalty next appt. Scheduled with Jade on Monday and let patient know (and put in appt notes) he needs to schedule a  6 month follow up with Dr Ashley Royalty. AM

## 2021-02-01 NOTE — Telephone Encounter (Signed)
Please contact patient to schedule 6 month follow-up appt.   Sending 30 day refill.

## 2021-02-04 ENCOUNTER — Ambulatory Visit (INDEPENDENT_AMBULATORY_CARE_PROVIDER_SITE_OTHER): Payer: Self-pay | Admitting: Physician Assistant

## 2021-02-04 DIAGNOSIS — Z91199 Patient's noncompliance with other medical treatment and regimen due to unspecified reason: Secondary | ICD-10-CM

## 2021-02-04 NOTE — Progress Notes (Signed)
No show

## 2021-03-30 ENCOUNTER — Other Ambulatory Visit: Payer: Self-pay | Admitting: Family Medicine

## 2021-03-30 DIAGNOSIS — I10 Essential (primary) hypertension: Secondary | ICD-10-CM

## 2021-04-22 ENCOUNTER — Emergency Department (INDEPENDENT_AMBULATORY_CARE_PROVIDER_SITE_OTHER)
Admission: EM | Admit: 2021-04-22 | Discharge: 2021-04-22 | Disposition: A | Discharge status: Home / Self Care | Payer: PRIVATE HEALTH INSURANCE | Source: Home / Self Care | Attending: Family Medicine | Admitting: Family Medicine

## 2021-04-22 ENCOUNTER — Other Ambulatory Visit: Payer: Self-pay

## 2021-04-22 DIAGNOSIS — Z0130 Encounter for examination of blood pressure without abnormal findings: Secondary | ICD-10-CM | POA: Diagnosis not present

## 2021-04-22 DIAGNOSIS — I10 Essential (primary) hypertension: Secondary | ICD-10-CM | POA: Diagnosis not present

## 2021-04-22 MED ORDER — BLOOD PRESSURE KIT DEVI: 1.0000 [IU] | Freq: Every day | 0 refills | Status: AC

## 2021-04-22 MED ORDER — LISINOPRIL 2.5 MG PO TABS: 2.5000 mg | ORAL_TABLET | Freq: Every day | ORAL | 0 refills | Status: DC

## 2021-04-22 NOTE — ED Triage Notes (Signed)
Pt here today for possible BP issues. Says he started feeling lightheaded and antsy. Says he thinks he took 2 lisinoprils yesterday before church, when hes only supposed to take 1. Also took 1 this am, his usual dose.

## 2021-04-22 NOTE — ED Provider Notes (Signed)
[name]    CSN: [id number] Arrival date & time: 04/22/21  1039      History   Chief Complaint Chief Complaint  Patient presents with   BP issues    HPI Andrew [name] is a 41 y.o. male.   HPI  Patient is here for trouble with his blood pressure.  He takes lisinopril 5 mg a day for over a year.  He states usually this controls blood pressure well.  Review of his medical record indicates that his blood pressure usually is under 120/80.  He states he has had a couple of episodes where his blood pressure felt too low.  Yesterday, accidentally, patient took to his blood pressure pills.  He states he spent the rest of the day feeling shaky, tired, and lightheaded.  Today he feels somewhat better.  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  He is here for evaluation. No chest pain or shortness of breath.  No history of heart disease Patient was not successful in his effort to quit smoking.  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 Authorizing Provider  Blood Pressure Monitoring (BLOOD PRESSURE KIT) DEVI 1 Units by Does not apply route daily. 04/22/21  Yes Raylene Everts, MD  lisinopril (ZESTRIL) 2.5  MG tablet Take 1 tablet (2.5 mg total) by mouth daily. 04/22/21  Yes Raylene Everts, MD  EPINEPHrine 0.3 mg/0.3 mL IJ SOAJ injection Inject 0.3 mLs (0.3 mg total) into the muscle once. Use as needed for bee string.  Ok to substitute generic device 04/11/15   Copland, Gay Filler, MD  lisinopril (ZESTRIL) 5 MG tablet TAKE 1 TABLET (5 MG TOTAL) BY MOUTH DAILY. 04/01/21 05/01/21  Luetta Nutting, DO    Family History Family History  Problem Relation Age of Onset   Hypertension Mother    Diabetes Mother     Social History Social History   Tobacco Use   Smoking status: Former    Packs/day: 0.50    Types: Cigarettes    Quit date: 02/04/2016    Years since quitting: 5.2   Smokeless tobacco: Never  Substance Use Topics   Alcohol use: Yes    Comment: socially   Drug use: Yes    Types: Marijuana     Allergies   Bee venom   Review of Systems Review of Systems  See HPI Physical Exam Triage Vital Signs ED Triage Vitals  Enc Vitals Group     BP 04/22/21 1053 118/76     Pulse Rate 04/22/21 1053 64     Resp 04/22/21 1053 18  Temp 04/22/21 1053 97.8 F (36.6 C)     Temp Source 04/22/21 1053 Oral     SpO2 04/22/21 1053 97 %     Weight --      Height --      Head Circumference --      Peak Flow --      Pain Score 04/22/21 1051 0     Pain Loc --      Pain Edu? --      Excl. in [place]? --    No data found.  Updated Vital Signs BP 118/76 (BP Location: Right Arm)    Pulse 64    Temp 97.8 F (36.6 C) (Oral)    Resp 18    SpO2 97%      Physical Exam Constitutional:      General: He is not in acute distress.    Appearance: Normal appearance. He is well-developed. He is not ill-appearing.  HENT:     Head: Normocephalic and atraumatic.  Eyes:     Conjunctiva/sclera: Conjunctivae normal.     Pupils: Pupils are equal, round, and reactive to light.  Cardiovascular:     Rate and Rhythm: Normal rate and regular rhythm.     Heart sounds: Normal heart sounds.  Pulmonary:      Effort: Pulmonary effort is normal. No respiratory distress.     Breath sounds: Normal breath sounds.  Abdominal:     General: There is no distension.     Palpations: Abdomen is soft.  Musculoskeletal:        General: Normal range of motion.     Cervical back: Normal range of motion.  Skin:    General: Skin is warm and dry.  Neurological:     General: No focal deficit present.     Mental Status: He is alert.     UC Treatments / Results  Labs (all labs ordered are listed, but only abnormal results are displayed) Labs Reviewed - No data to display  EKG   Radiology No results found.  Procedures Procedures (including critical care time)  Medications Ordered in UC Medications - No data to display  Initial Impression / Assessment and Plan / UC Course  I have reviewed the triage vital signs and the nursing notes.  Pertinent labs & imaging results that were available during my care of the patient were reviewed by me and considered in my medical decision making (see chart for details).     I explained to the patient that his blood pressure is generally very well controlled on the lisinopril 5 mg a day.  I would predict that if he took 2 pills instead of 1, that he would be symptomatic.  This should improve pretty rapidly over just a day or 2.  I have given him a note to stay out of work today to return tomorrow. He has concerns about his blood pressure being too low at times.  I have given her a prescription for lisinopril 2.5 mg.  He is to take this daily.  He needs to check his blood pressure daily.  He needs to follow-up with his primary care doctor for management of hypertension. Final Clinical Impressions(s) / UC Diagnoses   Final diagnoses:  BP check  Essential hypertension, benign     Discharge Instructions      Make sure that you are drinking plenty of fluids Take lisinopril 2.5 mg a day (instead of 5 mg a day) Check your blood pressure daily.  The  goal is to  keep your blood pressure under 140/90 Follow-up with your usual primary care doctor   ED Prescriptions     Medication Sig Dispense Auth. Provider   lisinopril (ZESTRIL) 2.5 MG tablet Take 1 tablet (2.5 mg total) by mouth daily. 30 tablet Raylene Everts, MD   Blood Pressure Monitoring (BLOOD PRESSURE KIT) DEVI 1 Units by Does not apply route daily. 1 each Raylene Everts, MD      PDMP not reviewed this encounter.   Raylene Everts, MD 04/22/21 1308

## 2021-04-22 NOTE — Discharge Instructions (Signed)
Make sure that you are drinking plenty of fluids Take lisinopril 2.5 mg a day (instead of 5 mg a day) Check your blood pressure daily.  The goal is to keep your blood pressure under 140/90 Follow-up with your usual primary care doctor

## 2021-04-30 ENCOUNTER — Other Ambulatory Visit: Payer: Self-pay | Admitting: Family Medicine

## 2021-04-30 DIAGNOSIS — I10 Essential (primary) hypertension: Secondary | ICD-10-CM

## 2021-04-30 NOTE — Telephone Encounter (Signed)
Patient has been scheduled for 05/15/2021.  Also, patient said that his insurance company rejected the blood pressure montior

## 2021-04-30 NOTE — Telephone Encounter (Signed)
Pls contact patient to schedule appt for hypertension. Sending 15 day refill.

## 2021-05-15 ENCOUNTER — Ambulatory Visit: Payer: PRIVATE HEALTH INSURANCE | Admitting: Family Medicine

## 2021-06-12 ENCOUNTER — Ambulatory Visit: Payer: PRIVATE HEALTH INSURANCE | Admitting: Family Medicine

## 2021-06-19 ENCOUNTER — Other Ambulatory Visit: Payer: Self-pay

## 2021-06-19 ENCOUNTER — Encounter: Payer: Self-pay | Admitting: Family Medicine

## 2021-06-19 ENCOUNTER — Ambulatory Visit (INDEPENDENT_AMBULATORY_CARE_PROVIDER_SITE_OTHER): Payer: PRIVATE HEALTH INSURANCE | Admitting: Family Medicine

## 2021-06-19 VITALS — BP 117/69 | HR 72 | Ht 71.0 in | Wt 177.0 lb

## 2021-06-19 DIAGNOSIS — I10 Essential (primary) hypertension: Secondary | ICD-10-CM | POA: Diagnosis not present

## 2021-06-19 DIAGNOSIS — Z91030 Bee allergy status: Secondary | ICD-10-CM | POA: Diagnosis not present

## 2021-06-19 DIAGNOSIS — R200 Anesthesia of skin: Secondary | ICD-10-CM | POA: Diagnosis not present

## 2021-06-19 DIAGNOSIS — S6412XA Injury of median nerve at wrist and hand level of left arm, initial encounter: Secondary | ICD-10-CM

## 2021-06-19 DIAGNOSIS — R202 Paresthesia of skin: Secondary | ICD-10-CM

## 2021-06-19 HISTORY — DX: Anesthesia of skin: R20.0

## 2021-06-19 MED ORDER — EPINEPHRINE 0.3 MG/0.3ML IJ SOAJ: 0.3000 mg | Freq: Once | INTRAMUSCULAR | 1 refills | Status: AC

## 2021-06-19 MED ORDER — LISINOPRIL 2.5 MG PO TABS: 2.5000 mg | ORAL_TABLET | Freq: Every day | ORAL | 2 refills | Status: DC

## 2021-06-19 NOTE — Assessment & Plan Note (Signed)
Blood pressure remains well-controlled.  Continue lisinopril at current strength. 

## 2021-06-19 NOTE — Assessment & Plan Note (Signed)
Continues to have numbness with some weakness in grip strength.  I discussed with him that it would be unlikely for him to have any significant additional recovery at this point however he would like to see a hand surgeon for opinion.  Referral placed. ?

## 2021-06-19 NOTE — Progress Notes (Signed)
?Andrew [name] [age] y.o. male MRN [id number]  Date of birth: 10-Nov-1980 ? ?Subjective ?No chief complaint on file. ? ? ?HPI ?Andrew [name] is a 41 year old male here today for follow-up visit.  Doing well with lisinopril at current strength for management of hypertension.  He has not had side effects from this.  He denies chest pain, shortness of breath, palpitations, headaches or vision changes. ? ?He has had some issues with continued numbness and tingling in his left hand.  Previous history of significant laceration of which was repaired by hand surgeon.  He would like referral back to hand surgeon due to continued numbness and some weakness with grip strength. ? ?ROS:  A comprehensive ROS was completed and negative except as noted per HPI ? ?Allergies  ?Allergen Reactions  ? Bee Venom Anaphylaxis  ? ? ?Past Medical History:  ?Diagnosis Date  ? Allergy   ? bee sting  ? Broken finger 2001  ? Hypertension   ? Staph skin infection 2001  ? need surgical debridment and IV antibiotics   ? ? ?Past Surgical History:  ?Procedure Laterality Date  ? NERVE, TENDON AND ARTERY REPAIR Left 11/29/2014  ? Procedure:  REPAIR OF MEDIAN AND ULNA NERVE, REPAIR OF ULNA  ARTERY, REPAIR OF FLEXOR CARPAL ULNA TENDON ;  Surgeon: Charlotte Crumb, MD;  Location: [facility];  Service: Orthopedics;  Laterality: Left;  ? ? ?Social History  ? ?Socioeconomic History  ? Marital status: Single  ?  Spouse name: Not on file  ? Number of children: Not on file  ? Years of education: Not on file  ? Highest education level: Not on file  ?Occupational History  ? Not on file  ?Tobacco Use  ? Smoking status: Former  ?  Packs/day: 0.50  ?  Types: Cigarettes  ?  Quit date: 02/04/2016  ?  Years since quitting: 5.3  ? Smokeless tobacco: Never  ?Substance and Sexual Activity  ? Alcohol use: Yes  ?  Comment: socially  ? Drug use: Yes  ?  Types: Marijuana  ? Sexual activity: Not on file  ?Other Topics Concern  ? Not on file  ?Social History Narrative  ? Not on file  ? ?Social  Determinants of Health  ? ?Financial Resource Strain: Not on file  ?Food Insecurity: Not on file  ?Transportation Needs: Not on file  ?Physical Activity: Not on file  ?Stress: Not on file  ?Social Connections: Not on file  ? ? ?Family History  ?Problem Relation Age of Onset  ? Hypertension Mother   ? Diabetes Mother   ? ? ?Health Maintenance  ?Topic Date Due  ? COVID-19 Vaccine (3 - Booster for Moderna series) 11/22/2021 (Originally 01/05/2020)  ? INFLUENZA VACCINE  12/22/2021 (Originally 10/22/2020)  ? TETANUS/TDAP  08/22/2029  ? Hepatitis C Screening  Completed  ? HIV Screening  Completed  ? HPV VACCINES  Aged Out  ? ? ? ?----------------------------------------------------------------------------------------------------------------------------------------------------------------------------------------------------------------- ?Physical Exam ?BP 117/69 (BP Location: Left Arm, Patient Position: Sitting, Cuff Size: Normal)   Pulse 72   Ht 5\' 11"  (1.803 m)   Wt 177 lb (80.3 kg)   SpO2 98%   BMI 24.69 kg/m?  ? ?Physical Exam ?Constitutional:   ?   Appearance: Normal appearance.  ?Eyes:  ?   General: No scleral icterus. ?Cardiovascular:  ?   Rate and Rhythm: Normal rate and regular rhythm.  ?Pulmonary:  ?   Effort: Pulmonary effort is normal.  ?   Breath sounds: Normal breath sounds.  ?  Musculoskeletal:  ?   Cervical back: Neck supple.  ?Neurological:  ?   Mental Status: He is alert.  ?Psychiatric:     ?   Mood and Affect: Mood normal.     ?   Behavior: Behavior normal.  ? ? ?------------------------------------------------------------------------------------------------------------------------------------------------------------------------------------------------------------------- ?Assessment and Plan ? ?Bee allergy status ?EpiPen renewed. ? 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  I discussed with him that it would be unlikely for him to have any  significant additional recovery at this point however he would like to see a hand surgeon for opinion.  Referral placed. ? ?Essential hypertension ?Blood pressure remains well controlled.  Continue lisinopril at current strength. ? ? ?Meds ordered this encounter  ?Medications  ? lisinopril (ZESTRIL) 2.5 MG tablet  ?  Sig: Take 1 tablet (2.5 mg total) by mouth daily.  ?  Dispense:  90 tablet  ?  Refill:  2  ? EPINEPHrine 0.3 mg/0.3 mL IJ SOAJ injection  ?  Sig: Inject 0.3 mg into the muscle once for 1 dose. Use as needed for bee string.  Ok to substitute generic device  ?  Dispense:  2 each  ?  Refill:  1  ? ? ?Return in about 6 months (around 12/20/2021) for HTN. ? ? ? ?This visit occurred during the SARS-CoV-2 public health emergency.  Safety protocols were in place, including screening questions prior to the visit, additional usage of staff PPE, and extensive cleaning of exam room while observing appropriate contact time as indicated for disinfecting solutions.  ? ?

## 2021-06-19 NOTE — Assessment & Plan Note (Signed)
EpiPen renewed. 

## 2021-07-02 ENCOUNTER — Ambulatory Visit: Payer: PRIVATE HEALTH INSURANCE | Admitting: Orthopedic Surgery

## 2021-07-09 ENCOUNTER — Ambulatory Visit: Payer: PRIVATE HEALTH INSURANCE | Admitting: Orthopedic Surgery

## 2021-07-18 ENCOUNTER — Ambulatory Visit: Payer: Self-pay

## 2021-07-18 ENCOUNTER — Ambulatory Visit (INDEPENDENT_AMBULATORY_CARE_PROVIDER_SITE_OTHER): Payer: PRIVATE HEALTH INSURANCE | Admitting: Orthopedic Surgery

## 2021-07-18 DIAGNOSIS — S6412XA Injury of median nerve at wrist and hand level of left arm, initial encounter: Secondary | ICD-10-CM | POA: Diagnosis not present

## 2021-07-18 DIAGNOSIS — R202 Paresthesia of skin: Secondary | ICD-10-CM

## 2021-07-18 DIAGNOSIS — S6402XA Injury of ulnar nerve at wrist and hand level of left arm, initial encounter: Secondary | ICD-10-CM | POA: Diagnosis not present

## 2021-07-18 DIAGNOSIS — R200 Anesthesia of skin: Secondary | ICD-10-CM | POA: Diagnosis not present

## 2021-07-25 ENCOUNTER — Telehealth: Payer: Self-pay | Admitting: Neurology

## 2021-07-25 NOTE — Telephone Encounter (Signed)
Patient left vm asking if we can let him know his blood type.  ? ?Component 6 yr ago  ?ABO/RH(D) O POS   ?Antibody Screen NEG   ?Sample Expiration 12/02/2014   ?Resulting Agency [facility]  ? ?Called patient and made him aware.  ?

## 2021-08-09 ENCOUNTER — Encounter: Payer: Self-pay | Admitting: Orthopedic Surgery

## 2021-08-09 DIAGNOSIS — S6402XA Injury of ulnar nerve at wrist and hand level of left arm, initial encounter: Secondary | ICD-10-CM | POA: Insufficient documentation

## 2021-08-09 NOTE — Progress Notes (Signed)
Office Visit Note   Patient: Andrew [name]           Date of Birth: 01-04-1981           MRN: [id number] Visit Date: 07/18/2021              Requested by: Luetta Nutting, [name],  [place] [id number] PCP: Luetta Nutting, DO   Assessment & Plan: Visit Diagnoses:  1. Numbness and tingling in left hand   2. 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  He underwent operative repair of both nerve lacerations.  He has very little sensation in the median and ulnar nerve distributions.  Discussed with patient that despite this loss of sensation, he has reasonable motor function in the thenar and interosseous muscle groups.  We discussed that surgery for this condition would involve potential excision of neuroma or scarred nerve tissue followed by nerve grafting with likely sural autograft.  The problem is that he still has decent motor function despite the previous nerve injuries and lack of sensation.  Any further surgery could impair what motor function he has currently.  Patient would like to avoid surgery at this point.  Follow-Up Instructions: No follow-ups on file.   Orders:  Orders Placed This Encounter  Procedures   XR Hand Complete Left   XR Wrist Complete Left   No orders of the defined types were placed in this encounter.     Procedures: No procedures performed   Clinical Data: No additional findings.   Subjective: Chief Complaint  Patient presents with   Left Hand - Numbness    This is a 41 year old right-hand-dominant male who presents with numbness and tingling in the left hand.  This started after an injury in 2016 when she sustained a laceration to the volar aspect of the left wrist.  He underwent surgical exploration with repair of  the median nerve with a conduit and direct repair of the ulnar artery.  He notes that this numbness and paresthesias never resolved following surgery.  He works as a Freight forwarder and does not have any limitations in his hand function and is able to perform his daily tasks without issue.  He has reasonable strength in his hand with his only complaint being the sensory loss.   Review of Systems   Objective: Vital Signs: There were no vitals taken for this visit.  Physical Exam Constitutional:      Appearance: Normal appearance.  Cardiovascular:     Rate and Rhythm: Normal rate.     Pulses: Normal pulses.  Pulmonary:     Effort: Pulmonary effort is normal.  Skin:    General: Skin is warm and dry.     Capillary Refill: Capillary refill takes less than 2 seconds.  Neurological:     Mental Status: He is alert.    Left Hand Exam   Tenderness  The patient is experiencing no tenderness.   Other  Erythema: absent Sensation: decreased Pulse: present  Comments:  Two-point discrimination is >14 mm in all fingers.  Intact sensation on dorsum of hand.  Positive Tinel signs at incision line over median and ulnar nerves.  5/5 thenar motor strength.  4-/5 FDI strength. Subtle clawing of the  ring and small fingers.      Specialty Comments:  No specialty comments available.  Imaging: No results found.   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 04/05/2015   Past Medical History:  Diagnosis Date   Allergy    bee sting   Broken finger 2001   Hypertension    Numbness and tingling in left hand 06/19/2021   Staph skin infection 2001   need surgical debridment and IV antibiotics     Family History  Problem Relation  Age of Onset   Hypertension Mother    Diabetes Mother     Past Surgical History:  Procedure Laterality Date   NERVE, TENDON AND ARTERY REPAIR Left 11/29/2014   Procedure:  REPAIR OF MEDIAN AND ULNA NERVE, REPAIR OF ULNA  ARTERY, REPAIR OF FLEXOR CARPAL ULNA TENDON ;  Surgeon: Charlotte Crumb, MD;  Location: [facility];  Service: Orthopedics;  Laterality: Left;   Social History   Occupational History   Not on file  Tobacco Use   Smoking status: Former    Packs/day: 0.50    Types: Cigarettes    Quit date: 02/04/2016    Years since quitting: 5.5   Smokeless tobacco: Never  Substance and Sexual Activity   Alcohol use: Yes    Comment: socially   Drug use: Yes    Types: Marijuana   Sexual activity: Not on file

## 2021-08-26 ENCOUNTER — Telehealth: Payer: Self-pay

## 2021-08-26 MED ORDER — LISINOPRIL 5 MG PO TABS: 5.0000 mg | ORAL_TABLET | Freq: Every day | ORAL | 0 refills | Status: DC

## 2021-08-26 NOTE — Telephone Encounter (Signed)
Pt called stating that he missed 3 doses of his BP meds the weekend of 08/16/2021 as well as was consuming alcohol.  The following week his (last week) his blood pressure spiked at 255/180.  Pt states that he took his medication, took a capful of vinegar and rested the rest of the day.  On this day he experienced dizziness, feeling "out of it", SOB and vein in his left neck was "popping out".  Pt denied chest pains, arm pain, neck pain, speech difficulty, facial drooping and balance issues.  Pt states that since this time, his blood pressure has been running around 155/101 and the lowest reading he has gotten was 130/95.  Conveyed to pt the importance of an ER visit if his blood pressure is this elevated again.  Pt expressed understanding.  Please advise how to proceed since his BP is still elevated.  Tiajuana Amass, CMA

## 2021-08-26 NOTE — Telephone Encounter (Signed)
Pt informed of Dr. Ashley Royalty' recommendations.  Pt stated that he needed RX for lisinopril 5mg  tablets because he is almost out of 2.5mg  tablets.  RX sent to pharmacy.  , CMA

## 2021-08-26 NOTE — Telephone Encounter (Signed)
Recommend increase in lisinopril to 5mg  daily.  Continue to monitor BP at home.  Proceed to ER if developing symptoms of chest pain, headache, or stroke like symptoms.

## 2021-11-26 ENCOUNTER — Other Ambulatory Visit: Payer: Self-pay | Admitting: Family Medicine

## 2021-11-27 ENCOUNTER — Telehealth: Payer: Self-pay

## 2021-11-27 ENCOUNTER — Other Ambulatory Visit (HOSPITAL_BASED_OUTPATIENT_CLINIC_OR_DEPARTMENT_OTHER): Payer: Self-pay

## 2021-11-27 NOTE — Telephone Encounter (Signed)
Please contact the patient and advise that med refill has been sent to [facility] on [address]. Thanks

## 2021-11-27 NOTE — Telephone Encounter (Signed)
Andrew [name] came by in a frantic state. He has no more lisinopril (ZESTRIL) 5 MG tablet.   He stated that he has taken his last two tablets this morning and has no more. He asked that we call in the Rx as soon as possible so that his mother can pick up his Rx cause he isn't able to get off work in time to get it himself. I did make an appt for 12/10/2021 with him. tvt

## 2021-11-27 NOTE — Telephone Encounter (Signed)
I called the patient to let him know that the [facility] was [facility] to [facility], but there is no answer and no voicemail to leave a message. Tvt

## 2021-12-10 ENCOUNTER — Ambulatory Visit: Payer: PRIVATE HEALTH INSURANCE | Admitting: Family Medicine

## 2021-12-27 ENCOUNTER — Ambulatory Visit: Payer: PRIVATE HEALTH INSURANCE | Admitting: Family Medicine

## 2022-02-27 ENCOUNTER — Other Ambulatory Visit: Payer: Self-pay

## 2022-02-27 ENCOUNTER — Other Ambulatory Visit: Payer: Self-pay | Admitting: Family Medicine

## 2022-02-27 MED ORDER — LISINOPRIL 5 MG PO TABS: 5.0000 mg | ORAL_TABLET | Freq: Every day | ORAL | 0 refills | Status: DC

## 2022-02-27 NOTE — Telephone Encounter (Signed)
Patient already has an appointment for 03/06/22. tvt

## 2022-02-27 NOTE — Telephone Encounter (Signed)
Please contact the patient to schedule appt for HTN. Past due follow-up from September. Last med refill sent in 11/27/21 with refills. Thanks

## 2022-03-06 ENCOUNTER — Ambulatory Visit (INDEPENDENT_AMBULATORY_CARE_PROVIDER_SITE_OTHER): Payer: PRIVATE HEALTH INSURANCE | Admitting: Family Medicine

## 2022-03-06 ENCOUNTER — Encounter: Payer: Self-pay | Admitting: Family Medicine

## 2022-03-06 VITALS — BP 112/73 | HR 65 | Ht 71.0 in | Wt 183.0 lb

## 2022-03-06 DIAGNOSIS — F17210 Nicotine dependence, cigarettes, uncomplicated: Secondary | ICD-10-CM | POA: Diagnosis not present

## 2022-03-06 DIAGNOSIS — I10 Essential (primary) hypertension: Secondary | ICD-10-CM | POA: Diagnosis not present

## 2022-03-06 DIAGNOSIS — Z13220 Encounter for screening for lipoid disorders: Secondary | ICD-10-CM | POA: Diagnosis not present

## 2022-03-06 DIAGNOSIS — Z113 Encounter for screening for infections with a predominantly sexual mode of transmission: Secondary | ICD-10-CM

## 2022-03-06 MED ORDER — VARENICLINE TARTRATE (STARTER) 0.5 MG X 11 & 1 MG X 42 PO TBPK: ORAL_TABLET | ORAL | 0 refills | Status: DC

## 2022-03-06 MED ORDER — VARENICLINE TARTRATE 1 MG PO TABS: 1.0000 mg | ORAL_TABLET | Freq: Two times a day (BID) | ORAL | 1 refills | Status: DC

## 2022-03-06 MED ORDER — LISINOPRIL 5 MG PO TABS: 5.0000 mg | ORAL_TABLET | Freq: Every day | ORAL | 1 refills | Status: DC

## 2022-03-06 NOTE — Assessment & Plan Note (Signed)
BP is well controlled.  I reassured him that there have not been any recalls on lisinopril this year.  Reviewed typical side effects and he has not noticed any of these.   Updating labs.

## 2022-03-06 NOTE — Assessment & Plan Note (Signed)
Counseled on smoking cessation.  He would like to try chantix.  Discussed setting quit date.  Boxed warning and typical side effects reviewed.

## 2022-03-06 NOTE — Progress Notes (Signed)
Andrew [name] - 41 y.o. male MRN [id number]  Date of birth: 1980/10/15  Subjective Chief Complaint  Patient presents with   Medication Problem   Hypertension   Chest Pain    HPI Andrew [name] is a 41 y.o. here today for follow up of HTN.   He reports that he is doing well with lisinopril but does have concerns about side effects.  No side effects that he has noticed, he has just heard about possible side effects.  He had also seen where this had been recalled in the past.  Current management with lisinopirl 5mg  daily.  He is not checking BP at home.  Denies symptoms related HTN including chest pain, shortness of breath, palpitations, headache or vision changes.  Continues to use nicotine products, interested in quitting.  He would like to try chantix.    Requesting STI testing.  Denies symptoms.  ROS:  A comprehensive ROS was completed and negative except as noted per HPI  Allergies  Allergen Reactions   Bee Venom Anaphylaxis    Past Medical History:  Diagnosis Date   Allergy    bee sting   Broken finger 2001   Hypertension    Numbness and tingling in left hand 06/19/2021   Staph skin infection 2001   need surgical debridment and IV antibiotics     Past Surgical History:  Procedure Laterality Date   NERVE, TENDON AND ARTERY REPAIR Left 11/29/2014   Procedure:  REPAIR OF MEDIAN AND ULNA NERVE, REPAIR OF ULNA  ARTERY, REPAIR OF FLEXOR CARPAL ULNA TENDON ;  Surgeon: Charlotte Crumb, MD;  Location: [facility];  Service: Orthopedics;  Laterality: Left;    Social History   Socioeconomic History   Marital status: Single    Spouse name: Not on file   Number of children: Not on file   Years of education: Not on file   Highest education level: Not on file  Occupational History   Not on file  Tobacco Use   Smoking status: Former    Packs/day: 0.50    Types: Cigarettes    Quit date: 02/04/2016    Years since quitting: 6.0   Smokeless tobacco: Never  Substance and Sexual  Activity   Alcohol use: Yes    Comment: socially   Drug use: Yes    Types: Marijuana   Sexual activity: Not on file  Other Topics Concern   Not on file  Social History Narrative   Not on file   Social Determinants of Health   Financial Resource Strain: Not on file  Food Insecurity: Not on file  Transportation Needs: Not on file  Physical Activity: Not on file  Stress: Not on file  Social Connections: Not on file    Family History  Problem Relation Age of Onset   Hypertension Mother    Diabetes Mother     Health Maintenance  Topic Date Due   INFLUENZA VACCINE  Never done   COVID-19 Vaccine (3 - 2023-24 season) 11/22/2021   DTaP/Tdap/Td (3 - Td or Tdap) 08/22/2029   Hepatitis C Screening  Completed   HIV Screening  Completed   HPV VACCINES  Aged Out     ----------------------------------------------------------------------------------------------------------------------------------------------------------------------------------------------------------------- Physical Exam BP 112/73 (BP Location: Left Arm, Patient Position: Sitting, Cuff Size: Large)   Pulse 65   Ht 5\' 11"  (1.803 m)   Wt 183 lb (83 kg)   SpO2 99%   BMI 25.52 kg/m   Physical Exam Constitutional:  Appearance: Normal appearance.  HENT:     Head: Normocephalic and atraumatic.  Eyes:     General: No scleral icterus. Cardiovascular:     Rate and Rhythm: Normal rate and regular rhythm.  Pulmonary:     Effort: Pulmonary effort is normal.     Breath sounds: Normal breath sounds.  Musculoskeletal:     Cervical back: Neck supple.  Neurological:     Mental Status: He is alert.  Psychiatric:        Mood and Affect: Mood normal.        Behavior: Behavior normal.     ------------------------------------------------------------------------------------------------------------------------------------------------------------------------------------------------------------------- Assessment and  Plan  Nicotine dependence Counseled on smoking cessation.  He would like to try chantix.  Discussed setting quit date.  Boxed warning and typical side effects reviewed.   Essential hypertension BP is well controlled.  I reassured him that there have not been any recalls on lisinopril this year.  Reviewed typical side effects and he has not noticed any of these.   Updating labs.     Meds ordered this encounter  Medications   Varenicline Tartrate, Starter, (CHANTIX STARTING MONTH PAK) 0.5 MG X 11 & 1 MG X 42 TBPK    Sig: Take as directed on packaging    Dispense:  53 each    Refill:  0   varenicline (CHANTIX CONTINUING MONTH PAK) 1 MG tablet    Sig: Take 1 tablet (1 mg total) by mouth 2 (two) times daily.    Dispense:  60 tablet    Refill:  1   lisinopril (ZESTRIL) 5 MG tablet    Sig: Take 1 tablet (5 mg total) by mouth daily.    Dispense:  90 tablet    Refill:  1    Return in about 6 months (around 09/05/2022) for HTN/Smoking cessation.    This visit occurred during the SARS-CoV-2 public health emergency.  Safety protocols were in place, including screening questions prior to the visit, additional usage of staff PPE, and extensive cleaning of exam room while observing appropriate contact time as indicated for disinfecting solutions.

## 2022-03-08 ENCOUNTER — Ambulatory Visit (HOSPITAL_COMMUNITY)
Admission: EM | Admit: 2022-03-08 | Discharge: 2022-03-08 | Disposition: A | Discharge status: Home / Self Care | Payer: PRIVATE HEALTH INSURANCE | Attending: Emergency Medicine | Admitting: Emergency Medicine

## 2022-03-08 ENCOUNTER — Encounter (HOSPITAL_COMMUNITY): Payer: Self-pay | Admitting: Emergency Medicine

## 2022-03-08 ENCOUNTER — Other Ambulatory Visit: Payer: Self-pay

## 2022-03-08 DIAGNOSIS — R369 Urethral discharge, unspecified: Secondary | ICD-10-CM | POA: Diagnosis present

## 2022-03-08 MED ORDER — DOXYCYCLINE HYCLATE 100 MG PO CAPS: 100.0000 mg | ORAL_CAPSULE | Freq: Two times a day (BID) | ORAL | 0 refills | Status: DC

## 2022-03-08 MED ORDER — CEFTRIAXONE SODIUM 500 MG IJ SOLR
INTRAMUSCULAR | Status: AC
  Filled 2022-03-08: qty 500

## 2022-03-08 MED ORDER — CEFTRIAXONE SODIUM 1 G IJ SOLR
0.5000 g | Freq: Once | INTRAMUSCULAR | Status: AC
  Administered 2022-03-08: 0.5 g via INTRAMUSCULAR

## 2022-03-08 NOTE — Discharge Instructions (Addendum)
We will call you if any of your test results warrant a change in your plan of care.  You may view these test results on MyChart.   Doxycycline is being sent to the pharmacy, you will take this medication 2 times daily for the next 7 days.  Please make sure to take this medication with a full glass of water.    Please refrain from any sexual activity until finishing the antibiotic and receiving all your test results.

## 2022-03-08 NOTE — ED Triage Notes (Signed)
Saw primary on Thursday.  This morning noticed discharge

## 2022-03-08 NOTE — ED Provider Notes (Signed)
[facility]    CSN: [id number] Arrival date & time: 03/08/22  1212      History   Chief Complaint Chief Complaint  Patient presents with   SEXUALLY TRANSMITTED DISEASE    HPI Andrew [name] is a 41 y.o. male.  Patient presents complaining of penile discharge that started this morning.  Patient reports a white to yellow discharge.  Patient states that he was seen by his PCP on Thursday and had STD testing performed with negative results.  Patient reports he has 1 new male sexual partner, last encounter happened a couple weeks ago, patient states condom was used but the condom broke.  Patient reports that symptoms feel like a previous episode where he had an STD.  Patient reports an area of bulging and has right groin area.  Patient denies any urinary symptoms, testicular pain, abdominal pain, nausea, vomiting, or any systemic symptoms.   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 Authorizing Provider  doxycycline (VIBRAMYCIN) 100 MG capsule Take 1 capsule (100 mg total) by mouth  2 (two) times daily. 03/08/22  Yes Flossie Dibble, NP  Blood Pressure Monitoring (BLOOD PRESSURE KIT) DEVI 1 Units by Does not apply route daily. 04/22/21   Raylene Everts, MD  lisinopril (ZESTRIL) 5 MG tablet Take 1 tablet (5 mg total) by mouth daily. 03/06/22   Luetta Nutting, DO  varenicline (CHANTIX CONTINUING MONTH PAK) 1 MG tablet Take 1 tablet (1 mg total) by mouth 2 (two) times daily. 03/06/22   Luetta Nutting, DO  Varenicline Tartrate, Starter, (CHANTIX STARTING MONTH PAK) 0.5 MG X 11 & 1 MG X 42 TBPK Take as directed on packaging 03/06/22   Luetta Nutting, DO    Family History Family History  Problem Relation Age of Onset   Hypertension Mother    Diabetes Mother     Social History Social History   Tobacco Use   Smoking status: Every Day    Packs/day: 0.50    Types: Cigarettes    Last attempt to quit: 02/04/2016    Years since quitting: 6.0   Smokeless tobacco: Never  Vaping Use   Vaping Use: Never used  Substance Use Topics   Alcohol use: Yes    Comment: socially   Drug use: Yes    Types: Marijuana     Allergies   Bee venom   Review of Systems Review of Systems Per HPI  Physical Exam Triage Vital Signs  ED Triage Vitals  Enc Vitals Group     BP 03/08/22 1419 124/77     Pulse Rate 03/08/22 1419 89     Resp 03/08/22 1419 18     Temp 03/08/22 1419 98.1 F (36.7 C)     Temp Source 03/08/22 1419 Oral     SpO2 03/08/22 1419 97 %     Weight --      Height --      Head Circumference --      Peak Flow --      Pain Score 03/08/22 1417 0     Pain Loc --      Pain Edu? --      Excl. in [place]? --    No data found.  Updated Vital Signs BP 124/77 (BP Location: Left Arm)   Pulse 89   Temp 98.1 F (36.7 C) (Oral)   Resp 18   SpO2 97%  Physical Exam Vitals and nursing note reviewed. Exam conducted with a chaperone present [facility] CMA).  Constitutional:      Appearance: Normal appearance.  Abdominal:     Hernia: There is no hernia in the left  inguinal area or right inguinal area.  Genitourinary:    Penis: Circumcised. Discharge present. No erythema, tenderness, swelling or lesions.      Testes: Normal.        Right: Mass, tenderness or swelling not present. Right testis is descended.        Left: Mass, tenderness or swelling not present. Left testis is descended.     Epididymis:     Right: Normal. Not inflamed or enlarged. No mass or tenderness.     Left: Normal. Not inflamed or enlarged. No mass or tenderness.     Comments: Palpable lymph node noted on RT side of inguinal canal.   Yellow, thick discharge was noted at the site of urethral opening.  Lymphadenopathy:     Lower Body: Right inguinal adenopathy present. No left inguinal adenopathy.  Neurological:     Mental Status: He is alert.      UC Treatments / Results  Labs (all labs ordered are listed, but only abnormal results are displayed) Labs Reviewed  CYTOLOGY, (ORAL, ANAL, URETHRAL) ANCILLARY ONLY    EKG   Radiology No results found.  Procedures Procedures (including critical care time)  Medications Ordered in UC Medications  cefTRIAXone (ROCEPHIN) injection 0.5 g (0.5 g Intramuscular Given 03/08/22 1559)    Initial Impression / Assessment and Plan / UC Course  I have reviewed the triage vital signs and the nursing notes.  Pertinent labs & imaging results that were available during my care of the patient were reviewed by me and considered in my medical decision making (see chart for details).     Patient was evaluated for penile discharge.  Based on symptomology and physical assessment, patient was empirically treated for gonorrhea and chlamydia.  Rocephin injection was given in office.  Doxycycline antibiotic was sent to the pharmacy, patient was made aware of treatment regiment and possible side effects.  Patient was made aware to refrain from any sexual activity at this time until completing his antimicrobial regiment and receiving all his test  results. Patient made aware of timeline for symptom resolution and when follow-up would be necessary.  Patient made aware of results reporting protocol and MyChart.  Patient verbalized understanding of instructions.    Charting was provided using a a verbal dictation system, charting was proofread for errors, errors may occur  which could change the meaning of the information charted.   Final Clinical Impressions(s) / UC Diagnoses   Final diagnoses:  Penile discharge     Discharge Instructions      We will call you if any of your test results warrant a change in your plan of care.  You may view these test results on MyChart.   Doxycycline is being sent to the pharmacy, you will take this medication 2 times daily for the next 7 days.  Please make sure to take this medication with a full glass of water.    Please refrain from any sexual activity until finishing the antibiotic and receiving all your test results.      ED Prescriptions     Medication Sig Dispense Auth. Provider   doxycycline (VIBRAMYCIN) 100 MG capsule Take 1 capsule (100 mg total) by mouth 2 (two) times daily. 14 capsule Flossie Dibble, NP      PDMP not reviewed this encounter.   Flossie Dibble, NP 03/09/22 1031

## 2022-03-10 LAB — CYTOLOGY, (ORAL, ANAL, URETHRAL) ANCILLARY ONLY
Chlamydia: NEGATIVE
Comment: NEGATIVE
Comment: NEGATIVE
Comment: NORMAL
Neisseria Gonorrhea: POSITIVE — AB
Trichomonas: NEGATIVE

## 2022-03-11 LAB — CBC WITH DIFFERENTIAL/PLATELET
Absolute Monocytes: 405 cells/uL (ref 200–950)
Basophils Absolute: 22 cells/uL (ref 0–200)
Basophils Relative: 0.5 %
Eosinophils Absolute: 202 cells/uL (ref 15–500)
Eosinophils Relative: 4.6 %
HCT: 39.1 % (ref 38.5–50.0)
Hemoglobin: 13.2 g/dL (ref 13.2–17.1)
Lymphs Abs: 1329 cells/uL (ref 850–3900)
MCH: 30.3 pg (ref 27.0–33.0)
MCHC: 33.8 g/dL (ref 32.0–36.0)
MCV: 89.7 fL (ref 80.0–100.0)
MPV: 13.6 fL — ABNORMAL HIGH (ref 7.5–12.5)
Monocytes Relative: 9.2 %
Neutro Abs: 2442 cells/uL (ref 1500–7800)
Neutrophils Relative %: 55.5 %
Platelets: 136 10*3/uL — ABNORMAL LOW (ref 140–400)
RBC: 4.36 10*6/uL (ref 4.20–5.80)
RDW: 12.7 % (ref 11.0–15.0)
Total Lymphocyte: 30.2 %
WBC: 4.4 10*3/uL (ref 3.8–10.8)

## 2022-03-11 LAB — COMPLETE METABOLIC PANEL WITH GFR
AG Ratio: 1.9 (calc) (ref 1.0–2.5)
ALT: 10 U/L (ref 9–46)
AST: 13 U/L (ref 10–40)
Albumin: 4.4 g/dL (ref 3.6–5.1)
Alkaline phosphatase (APISO): 78 U/L (ref 36–130)
BUN: 10 mg/dL (ref 7–25)
CO2: 32 mmol/L (ref 20–32)
Calcium: 9.1 mg/dL (ref 8.6–10.3)
Chloride: 102 mmol/L (ref 98–110)
Creat: 1 mg/dL (ref 0.60–1.29)
Globulin: 2.3 g/dL (calc) (ref 1.9–3.7)
Glucose, Bld: 78 mg/dL (ref 65–139)
Potassium: 3.8 mmol/L (ref 3.5–5.3)
Sodium: 140 mmol/L (ref 135–146)
Total Bilirubin: 0.5 mg/dL (ref 0.2–1.2)
Total Protein: 6.7 g/dL (ref 6.1–8.1)
eGFR: 97 mL/min/{1.73_m2} (ref >=60)

## 2022-03-11 LAB — RPR: RPR Ser Ql: NONREACTIVE

## 2022-03-11 LAB — NEISSERIA GONORRHOEAE, TMA (ALTERNATE TARGET), UROGENITAL: NEISSERIA GONORRHOEAE,TMA (ALT TARGET),UROGENITAL: DETECTED — AB

## 2022-03-11 LAB — LIPID PANEL W/REFLEX DIRECT LDL
Cholesterol: 136 mg/dL (ref <200)
HDL: 46 mg/dL (ref >=40)
LDL Cholesterol (Calc): 70 mg/dL (calc)
Non-HDL Cholesterol (Calc): 90 mg/dL (calc) (ref <130)
Total CHOL/HDL Ratio: 3 (calc) (ref <5.0)
Triglycerides: 123 mg/dL (ref <150)

## 2022-03-11 LAB — TRICHOMONAS VAGINALIS, PROBE AMP: Trichomonas vaginalis RNA: NOT DETECTED

## 2022-03-11 LAB — HIV ANTIBODY (ROUTINE TESTING W REFLEX): HIV 1&2 Ab, 4th Generation: NONREACTIVE

## 2022-03-11 LAB — CHLAMYDIA/NEISSERIA GONORRHOEAE RNA,TMA,UROGENTIAL
C. trachomatis RNA, TMA: NOT DETECTED
N. gonorrhoeae RNA, TMA: DETECTED — AB

## 2022-05-06 ENCOUNTER — Ambulatory Visit (INDEPENDENT_AMBULATORY_CARE_PROVIDER_SITE_OTHER): Payer: PRIVATE HEALTH INSURANCE | Admitting: Primary Care

## 2022-05-29 ENCOUNTER — Telehealth: Payer: Self-pay

## 2022-05-29 ENCOUNTER — Encounter: Payer: Self-pay | Admitting: Sports Medicine

## 2022-05-29 ENCOUNTER — Ambulatory Visit (INDEPENDENT_AMBULATORY_CARE_PROVIDER_SITE_OTHER): Payer: PRIVATE HEALTH INSURANCE | Admitting: Sports Medicine

## 2022-05-29 VITALS — HR 75 | Wt 184.0 lb

## 2022-05-29 DIAGNOSIS — B360 Pityriasis versicolor: Secondary | ICD-10-CM | POA: Diagnosis not present

## 2022-05-29 DIAGNOSIS — Z202 Contact with and (suspected) exposure to infections with a predominantly sexual mode of transmission: Secondary | ICD-10-CM | POA: Diagnosis not present

## 2022-05-29 MED ORDER — FLUCONAZOLE 200 MG PO TABS: 200.0000 mg | ORAL_TABLET | ORAL | 0 refills | Status: DC

## 2022-05-29 MED ORDER — METRONIDAZOLE 500 MG PO TABS: 500.0000 mg | ORAL_TABLET | Freq: Two times a day (BID) | ORAL | 0 refills | Status: DC

## 2022-05-29 MED ORDER — METRONIDAZOLE 500 MG PO TABS: 500.0000 mg | ORAL_TABLET | Freq: Two times a day (BID) | ORAL | 0 refills | Status: AC

## 2022-05-29 NOTE — Progress Notes (Signed)
    Procedures performed today:    None.  Independent interpretation of notes and tests performed by another provider:   None.  Brief History, Exam, Impression, and Recommendations:    Exposure to sexually transmitted disease (STD) Pleasant 42 year old male, exposure to trichomonas from his girlfriend. No current symptoms, I will go and treat him for trichomonas but will also check for other STDs. Precautions regarding avoiding sex and protection advised until we have a diagnosis.  Tinea versicolor Noted tinea versicolor, adding Diflucan.    ____________________________________________ Gwen Her. Dianah Field, M.D., ABFM., CAQSM., AME. Primary Care and Sports Medicine [facility]  Adjunct Professor of [place] [employer] [facility] [facility] of Medicine  Risk manager

## 2022-05-29 NOTE — Assessment & Plan Note (Signed)
Pleasant 42 year old male, exposure to trichomonas from his girlfriend. No current symptoms, I will go and treat him for trichomonas but will also check for other STDs. Precautions regarding avoiding sex and protection advised until we have a diagnosis.

## 2022-05-29 NOTE — Assessment & Plan Note (Signed)
Noted tinea versicolor, adding Diflucan.

## 2022-05-29 NOTE — Telephone Encounter (Signed)
Pt lvm requesting appt for possible std exposure. Pt has seen Dr. Darene Lamer today.

## 2022-05-29 NOTE — Addendum Note (Signed)
Addended by: Tarri Glenn A on: 05/29/2022 02:07 PM   Modules accepted: Orders

## 2022-05-30 LAB — HIV ANTIBODY (ROUTINE TESTING W REFLEX): HIV 1&2 Ab, 4th Generation: NONREACTIVE

## 2022-05-30 LAB — HEPATITIS PANEL, ACUTE
Hep A IgM: NONREACTIVE
Hep B C IgM: NONREACTIVE
Hepatitis B Surface Ag: NONREACTIVE
Hepatitis C Ab: NONREACTIVE

## 2022-05-30 LAB — RPR: RPR Ser Ql: NONREACTIVE

## 2022-05-30 LAB — C. TRACHOMATIS/N. GONORRHOEAE RNA
C. trachomatis RNA, TMA: NOT DETECTED
N. gonorrhoeae RNA, TMA: NOT DETECTED

## 2022-06-13 ENCOUNTER — Other Ambulatory Visit: Payer: Self-pay | Admitting: Family Medicine

## 2022-06-13 DIAGNOSIS — I10 Essential (primary) hypertension: Secondary | ICD-10-CM

## 2022-06-17 ENCOUNTER — Other Ambulatory Visit: Payer: Self-pay

## 2022-09-07 ENCOUNTER — Other Ambulatory Visit: Payer: Self-pay | Admitting: Family Medicine

## 2022-09-07 DIAGNOSIS — I10 Essential (primary) hypertension: Secondary | ICD-10-CM

## 2022-09-08 NOTE — Telephone Encounter (Signed)
Patient scheduled for Thursday 09/11/22, thanks.

## 2022-09-08 NOTE — Telephone Encounter (Signed)
Pls contact the patient to schedule 6-mth HTN follow-up and med refills. Sending 30 day refill. Thanks

## 2022-09-11 ENCOUNTER — Encounter: Payer: Self-pay | Admitting: Family Medicine

## 2022-09-11 ENCOUNTER — Other Ambulatory Visit: Payer: Self-pay | Admitting: Family Medicine

## 2022-09-11 ENCOUNTER — Ambulatory Visit (INDEPENDENT_AMBULATORY_CARE_PROVIDER_SITE_OTHER): Payer: PRIVATE HEALTH INSURANCE | Admitting: Family Medicine

## 2022-09-11 VITALS — BP 123/76 | HR 74 | Ht 71.0 in | Wt 193.0 lb

## 2022-09-11 DIAGNOSIS — B360 Pityriasis versicolor: Secondary | ICD-10-CM | POA: Diagnosis not present

## 2022-09-11 DIAGNOSIS — Z202 Contact with and (suspected) exposure to infections with a predominantly sexual mode of transmission: Secondary | ICD-10-CM

## 2022-09-11 DIAGNOSIS — I10 Essential (primary) hypertension: Secondary | ICD-10-CM | POA: Diagnosis not present

## 2022-09-11 DIAGNOSIS — F17210 Nicotine dependence, cigarettes, uncomplicated: Secondary | ICD-10-CM

## 2022-09-11 DIAGNOSIS — Z91030 Bee allergy status: Secondary | ICD-10-CM

## 2022-09-11 MED ORDER — KETOCONAZOLE 2 % EX SHAM: 1.0000 | MEDICATED_SHAMPOO | CUTANEOUS | 0 refills | Status: DC

## 2022-09-11 MED ORDER — FLUCONAZOLE 200 MG PO TABS: 200.0000 mg | ORAL_TABLET | ORAL | 0 refills | Status: DC

## 2022-09-11 MED ORDER — EPINEPHRINE 0.3 MG/0.3ML IJ SOAJ: 0.3000 mg | INTRAMUSCULAR | 0 refills | Status: DC | PRN

## 2022-09-11 NOTE — Progress Notes (Signed)
Andrew [name] - 42 y.o. male MRN [id number]  Date of birth: [birth year]  Subjective Chief Complaint  Patient presents with   Hypertension   Diabetes    HPI Andrew [name] is  42 y.o. male here today for follow up visit.   He is currently taking lisinopril 5mg  daily for management of HTN.  He has quit smoking since last visit.  He denies side effects.  He has not had chest pain, shortness of breath, palpitations, headache or vision changes.    He also reports potential STI exposure.  He denies symptoms at this time including dysuria, penile discharge, fever, testicular pain.    Tinea versicolor has flared back up requesting renewal of fluconazole.   ROS:  A comprehensive ROS was completed and negative except as noted per HPI  Allergies  Allergen Reactions   Bee Venom Anaphylaxis    Past Medical History:  Diagnosis Date   Allergy    bee sting   Broken finger 2001   Hypertension    Numbness and tingling in left hand 06/19/2021   Staph skin infection 2001   need surgical debridment and IV antibiotics     Past Surgical History:  Procedure Laterality Date   NERVE, TENDON AND ARTERY REPAIR Left 11/29/2014   Procedure:  REPAIR OF MEDIAN AND ULNA NERVE, REPAIR OF ULNA  ARTERY, REPAIR OF FLEXOR CARPAL ULNA TENDON ;  Surgeon: Dairl Ponder, MD;  Location: [facility] OR;  Service: Orthopedics;  Laterality: Left;    Social History   Socioeconomic History   Marital status: Single    Spouse name: Not on file   Number of children: Not on file   Years of education: Not on file   Highest education level: Not on file  Occupational History   Not on file  Tobacco Use   Smoking status: Former    Packs/day: .5    Types: Cigarettes    Quit date: 03/12/2022    Years since quitting: 0.5   Smokeless tobacco: Never  Vaping Use   Vaping Use: Never used  Substance and Sexual Activity   Alcohol use: Yes    Comment: socially   Drug use: Yes    Types: Marijuana   Sexual activity: Not on file   Other Topics Concern   Not on file  Social History Narrative   Not on file   Social Determinants of Health   Financial Resource Strain: Not on file  Food Insecurity: Not on file  Transportation Needs: Not on file  Physical Activity: Not on file  Stress: Not on file  Social Connections: Not on file    Family History  Problem Relation Age of Onset   Hypertension Mother    Diabetes Mother     Health Maintenance  Topic Date Due   COVID-19 Vaccine (3 - 2023-24 season) 09/27/2022 (Originally 11/22/2021)   INFLUENZA VACCINE  10/23/2022   DTaP/Tdap/Td (3 - Td or Tdap) 08/22/2029   Hepatitis C Screening  Completed   HIV Screening  Completed   HPV VACCINES  Aged Out     ----------------------------------------------------------------------------------------------------------------------------------------------------------------------------------------------------------------- Physical Exam BP 123/76 (BP Location: Left Arm, Patient Position: Sitting, Cuff Size: Large)   Pulse 74   Ht 5\' 11"  (1.803 m)   Wt 193 lb (87.5 kg)   SpO2 97%   BMI 26.92 kg/m   Physical Exam Constitutional:      Appearance: Normal appearance.  HENT:     Head: Normocephalic and atraumatic.  Eyes:     General:  No scleral icterus. Cardiovascular:     Rate and Rhythm: Normal rate and regular rhythm.  Pulmonary:     Effort: Pulmonary effort is normal.     Breath sounds: Normal breath sounds.  Musculoskeletal:     Cervical back: Neck supple.  Neurological:     Mental Status: He is alert.  Psychiatric:        Mood and Affect: Mood normal.        Behavior: Behavior normal.     ------------------------------------------------------------------------------------------------------------------------------------------------------------------------------------------------------------------- Assessment and Plan  Tinea versicolor Improved with fluconazole previously.  Repeating course.  Adding nizoral  shampoo as well.   Exposure to sexually transmitted disease (STD) STI testing ordered.  No symptoms at this time.   Nicotine dependence Congratulated on quitting smoking and encouraged to stay quit.    Meds ordered this encounter  Medications   fluconazole (DIFLUCAN) 200 MG tablet    Sig: Take 1 tablet (200 mg total) by mouth once a week.    Dispense:  4 tablet    Refill:  0   ketoconazole (NIZORAL) 2 % shampoo    Sig: Apply 1 Application topically 2 (two) times a week.    Dispense:  120 mL    Refill:  0    Return in about 6 months (around 03/13/2023) for HTN.    This visit occurred during the SARS-CoV-2 public health emergency.  Safety protocols were in place, including screening questions prior to the visit, additional usage of staff PPE, and extensive cleaning of exam room while observing appropriate contact time as indicated for disinfecting solutions.

## 2022-09-11 NOTE — Assessment & Plan Note (Signed)
Congratulated on quitting smoking and encouraged to stay quit.

## 2022-09-11 NOTE — Addendum Note (Signed)
Addended by: Ardyth Man on: 09/11/2022 09:38 AM   Modules accepted: Orders

## 2022-09-11 NOTE — Assessment & Plan Note (Addendum)
STI testing ordered.  No symptoms at this time.

## 2022-09-11 NOTE — Assessment & Plan Note (Signed)
Improved with fluconazole previously.  Repeating course.  Adding nizoral shampoo as well.

## 2022-09-11 NOTE — Assessment & Plan Note (Signed)
BP is well controlled.  He would like to try off of medication.  He can stop and monitor BP at home.  Follow up in 6 months.

## 2022-09-15 LAB — TRICHOMONAS VAGINALIS, PROBE AMP: Trichomonas vaginalis RNA: NOT DETECTED

## 2022-09-15 LAB — CBC WITH DIFFERENTIAL/PLATELET
Absolute Monocytes: 361 cells/uL (ref 200–950)
Basophils Absolute: 19 cells/uL (ref 0–200)
Basophils Relative: 0.5 %
Eosinophils Absolute: 190 cells/uL (ref 15–500)
Eosinophils Relative: 5 %
HCT: 42.4 % (ref 38.5–50.0)
Hemoglobin: 13.9 g/dL (ref 13.2–17.1)
Lymphs Abs: 1490 cells/uL (ref 850–3900)
MCH: 28.8 pg (ref 27.0–33.0)
MCHC: 32.8 g/dL (ref 32.0–36.0)
MCV: 87.8 fL (ref 80.0–100.0)
MPV: 13.1 fL — ABNORMAL HIGH (ref 7.5–12.5)
Monocytes Relative: 9.5 %
Neutro Abs: 1740 cells/uL (ref 1500–7800)
Neutrophils Relative %: 45.8 %
Platelets: 139 10*3/uL — ABNORMAL LOW (ref 140–400)
RBC: 4.83 10*6/uL (ref 4.20–5.80)
RDW: 13.1 % (ref 11.0–15.0)
Total Lymphocyte: 39.2 %
WBC: 3.8 10*3/uL (ref 3.8–10.8)

## 2022-09-15 LAB — RPR: RPR Ser Ql: NONREACTIVE

## 2022-09-15 LAB — COMPLETE METABOLIC PANEL WITH GFR
AG Ratio: 1.7 (calc) (ref 1.0–2.5)
ALT: 16 U/L (ref 9–46)
AST: 18 U/L (ref 10–40)
Albumin: 4.2 g/dL (ref 3.6–5.1)
Alkaline phosphatase (APISO): 65 U/L (ref 36–130)
BUN: 15 mg/dL (ref 7–25)
CO2: 24 mmol/L (ref 20–32)
Calcium: 9.3 mg/dL (ref 8.6–10.3)
Chloride: 105 mmol/L (ref 98–110)
Creat: 1.03 mg/dL (ref 0.60–1.29)
Globulin: 2.5 g/dL (calc) (ref 1.9–3.7)
Glucose, Bld: 81 mg/dL (ref 65–99)
Potassium: 4.5 mmol/L (ref 3.5–5.3)
Sodium: 139 mmol/L (ref 135–146)
Total Bilirubin: 0.5 mg/dL (ref 0.2–1.2)
Total Protein: 6.7 g/dL (ref 6.1–8.1)
eGFR: 93 mL/min/{1.73_m2} (ref >=60)

## 2022-09-15 LAB — TSH: TSH: 1.59 mIU/L (ref 0.40–4.50)

## 2022-09-15 LAB — HIV ANTIBODY (ROUTINE TESTING W REFLEX): HIV 1&2 Ab, 4th Generation: NONREACTIVE

## 2022-09-15 LAB — CHLAMYDIA/NEISSERIA GONORRHOEAE RNA,TMA,UROGENTIAL
C. trachomatis RNA, TMA: NOT DETECTED
N. gonorrhoeae RNA, TMA: NOT DETECTED

## 2022-10-27 ENCOUNTER — Other Ambulatory Visit: Payer: Self-pay | Admitting: Family Medicine

## 2022-10-27 DIAGNOSIS — I10 Essential (primary) hypertension: Secondary | ICD-10-CM

## 2022-10-28 ENCOUNTER — Other Ambulatory Visit: Payer: Self-pay | Admitting: Family Medicine

## 2023-03-03 ENCOUNTER — Encounter: Payer: Self-pay | Admitting: Family Medicine

## 2023-03-03 ENCOUNTER — Ambulatory Visit (INDEPENDENT_AMBULATORY_CARE_PROVIDER_SITE_OTHER): Payer: BC Managed Care – PPO | Admitting: Family Medicine

## 2023-03-03 VITALS — BP 139/85 | HR 64 | Ht 71.0 in | Wt 195.0 lb

## 2023-03-03 DIAGNOSIS — Z13220 Encounter for screening for lipoid disorders: Secondary | ICD-10-CM | POA: Diagnosis not present

## 2023-03-03 DIAGNOSIS — Z0000 Encounter for general adult medical examination without abnormal findings: Secondary | ICD-10-CM | POA: Insufficient documentation

## 2023-03-03 DIAGNOSIS — R351 Nocturia: Secondary | ICD-10-CM

## 2023-03-03 DIAGNOSIS — S6412XA Injury of median nerve at wrist and hand level of left arm, initial encounter: Secondary | ICD-10-CM

## 2023-03-03 NOTE — Assessment & Plan Note (Signed)
Well adult Orders Placed This Encounter  Procedures   CBC with Differential/Platelet   CMP14+EGFR   Lipid Panel With LDL/HDL Ratio   PSA   Ambulatory referral to Hand Surgery    Referral Priority:   Routine    Referral Type:   Surgical    Referral Reason:   Specialty Services Required    Requested Specialty:   Hand Surgery    Number of Visits Requested:   1  Screenings: per lab orders Immunizations:  Declines Anticipatory guidance/Risk factor reduction:  Recommendations per AVS.

## 2023-03-03 NOTE — Assessment & Plan Note (Signed)
He has signficant paresthesias and decreased grip strength which may impair some of the duties of his job.  Referral to hand specialist for second opinion regarding management.

## 2023-03-03 NOTE — Progress Notes (Signed)
Andrew [name] - 42 y.o. male MRN [id number]  Date of birth: [birth year]  Subjective Chief Complaint  Patient presents with   Wrist Injury    HPI Andrew [name] is a 42 y.o. male here today for annual exam.    He reports that he is doing ok.   He is moderately active but states that he is having increase difficulty with motor function in the L hand.  He is R handed.  Seen by orthopedics last year and surgery considered however there was concern about further impairment of motor function so he elected to not have this done.  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   He feels that diet is pretty good.   Former smoker.  Occasional EtOH use.   Review of Systems  Constitutional:  Negative for chills, fever, malaise/fatigue and weight loss.  HENT:  Negative for congestion, ear pain and sore throat.   Eyes:  Negative for blurred vision, double vision and pain.  Respiratory:  Negative for cough and shortness of breath.   Cardiovascular:  Negative for chest pain and palpitations.  Gastrointestinal:  Negative for abdominal pain, blood in stool, constipation, heartburn and nausea.  Genitourinary:  Negative for dysuria and urgency.  Musculoskeletal:  Negative for joint pain and myalgias.  Neurological:  Negative for dizziness and headaches.  Endo/Heme/Allergies:  Does not bruise/bleed easily.  Psychiatric/Behavioral:  Negative for depression. The patient is not nervous/anxious and does not have insomnia.     Allergies  Allergen Reactions   Bee Venom Anaphylaxis    Past Medical History:  Diagnosis Date   Allergy    bee sting   Broken finger 2001   Hypertension    Numbness and tingling in left hand 06/19/2021   Staph skin infection 2001   need surgical debridment and IV antibiotics     Past Surgical History:  Procedure Laterality Date   NERVE, TENDON AND ARTERY REPAIR Left 11/29/2014   Procedure:  REPAIR OF MEDIAN AND ULNA NERVE, REPAIR OF ULNA   ARTERY, REPAIR OF FLEXOR CARPAL ULNA TENDON ;  Surgeon: Dairl Ponder, MD;  Location: [facility] OR;  Service: Orthopedics;  Laterality: Left;    Social History   Socioeconomic History   Marital status: Single    Spouse name: Not on file   Number of children: Not on file   Years of education: Not on file   Highest education level: Not on file  Occupational History   Not on file  Tobacco Use   Smoking status: Former    Current packs/day: 0.00    Types: Cigarettes    Quit date: 03/12/2022    Years since quitting: 0.9   Smokeless tobacco: Never  Vaping Use   Vaping status: Never Used  Substance and Sexual Activity   Alcohol use: Yes    Comment: socially   Drug use: Yes    Types: Marijuana   Sexual activity: Not on file  Other Topics Concern   Not on file  Social History Narrative   Not on file   Social Determinants of Health   Financial Resource Strain: Not on file  Food Insecurity: Not on file  Transportation Needs: Not on file  Physical Activity: Not on file  Stress: Not on file  Social Connections: Unknown (08/06/2021)   Received from [facility]   Social Network    Social Network: Not on file    Family History  Problem Relation  Age of Onset   Hypertension Mother    Diabetes Mother     Health Maintenance  Topic Date Due   COVID-19 Vaccine (3 - 2023-24 season) 11/23/2022   INFLUENZA VACCINE  06/22/2023 (Originally 10/23/2022)   DTaP/Tdap/Td (3 - Td or Tdap) 08/22/2029   Hepatitis C Screening  Completed   HIV Screening  Completed   HPV VACCINES  Aged Out     ----------------------------------------------------------------------------------------------------------------------------------------------------------------------------------------------------------------- Physical Exam BP 139/85 (BP Location: Right Arm, Patient Position: Sitting, Cuff Size: Large)   Pulse 64   Ht 5\' 11"  (1.803 m)   Wt 195 lb (88.5 kg)   SpO2 97%   BMI 27.20 kg/m    Physical Exam Constitutional:      General: He is not in acute distress. HENT:     Head: Normocephalic and atraumatic.     Right Ear: Tympanic membrane and external ear normal.     Left Ear: Tympanic membrane and external ear normal.  Eyes:     General: No scleral icterus. Neck:     Thyroid: No thyromegaly.  Cardiovascular:     Rate and Rhythm: Normal rate and regular rhythm.     Heart sounds: Normal heart sounds.  Pulmonary:     Effort: Pulmonary effort is normal.     Breath sounds: Normal breath sounds.  Abdominal:     General: Bowel sounds are normal. There is no distension.     Palpations: Abdomen is soft.     Tenderness: There is no abdominal tenderness. There is no guarding.  Musculoskeletal:     Cervical back: Normal range of motion.     Comments: Previous laceration to wrist noted.  Grip strength 3.5-4/5 on L compared to R.    Lymphadenopathy:     Cervical: No cervical adenopathy.  Skin:    General: Skin is warm and dry.     Findings: No rash.  Neurological:     Mental Status: He is alert and oriented to person, place, and time.     Cranial Nerves: No cranial nerve deficit.     Motor: No abnormal muscle tone.  Psychiatric:        Mood and Affect: Mood normal.        Behavior: Behavior normal.     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  Referral to hand specialist for second opinion regarding management.   Well adult exam Well adult Orders Placed This Encounter  Procedures   CBC with Differential/Platelet   CMP14+EGFR   Lipid Panel With LDL/HDL Ratio   PSA   Ambulatory referral to Hand Surgery    Referral Priority:    Routine    Referral Type:   Surgical    Referral Reason:   Specialty Services Required    Requested Specialty:   Hand Surgery    Number of Visits Requested:   1  Screenings: per lab orders Immunizations:  Declines Anticipatory guidance/Risk factor reduction:  Recommendations per AVS.    No orders of the defined types were placed in this encounter.   No follow-ups on file.    This visit occurred during the SARS-CoV-2 public health emergency.  Safety protocols were in place, including screening questions prior to the visit, additional usage of staff PPE, and extensive cleaning of exam room while observing appropriate contact time as indicated for disinfecting solutions.

## 2023-03-03 NOTE — Patient Instructions (Signed)

## 2023-03-04 LAB — CBC WITH DIFFERENTIAL/PLATELET
Basophils Absolute: 0 10*3/uL (ref 0.0–0.2)
Basos: 0 %
EOS (ABSOLUTE): 0.1 10*3/uL (ref 0.0–0.4)
Eos: 4 %
Hematocrit: 43.5 % (ref 37.5–51.0)
Hemoglobin: 13.9 g/dL (ref 13.0–17.7)
Immature Grans (Abs): 0 10*3/uL (ref 0.0–0.1)
Immature Granulocytes: 0 %
Lymphocytes Absolute: 1.2 10*3/uL (ref 0.7–3.1)
Lymphs: 33 %
MCH: 28.7 pg (ref 26.6–33.0)
MCHC: 32 g/dL (ref 31.5–35.7)
MCV: 90 fL (ref 79–97)
Monocytes Absolute: 0.3 10*3/uL (ref 0.1–0.9)
Monocytes: 8 %
Neutrophils Absolute: 1.9 10*3/uL (ref 1.4–7.0)
Neutrophils: 55 %
Platelets: 166 10*3/uL (ref 150–450)
RBC: 4.85 x10E6/uL (ref 4.14–5.80)
RDW: 13.4 % (ref 11.6–15.4)
WBC: 3.5 10*3/uL (ref 3.4–10.8)

## 2023-03-04 LAB — CMP14+EGFR
ALT: 15 [IU]/L (ref 0–44)
AST: 19 [IU]/L (ref 0–40)
Albumin: 4.6 g/dL (ref 4.1–5.1)
Alkaline Phosphatase: 80 [IU]/L (ref 44–121)
BUN/Creatinine Ratio: 14 (ref 9–20)
BUN: 15 mg/dL (ref 6–24)
Bilirubin Total: 0.6 mg/dL (ref 0.0–1.2)
CO2: 24 mmol/L (ref 20–29)
Calcium: 9.5 mg/dL (ref 8.7–10.2)
Chloride: 101 mmol/L (ref 96–106)
Creatinine, Ser: 1.08 mg/dL (ref 0.76–1.27)
Globulin, Total: 2.4 g/dL (ref 1.5–4.5)
Glucose: 103 mg/dL — ABNORMAL HIGH (ref 70–99)
Potassium: 4.2 mmol/L (ref 3.5–5.2)
Sodium: 139 mmol/L (ref 134–144)
Total Protein: 7 g/dL (ref 6.0–8.5)
eGFR: 88 mL/min/{1.73_m2} (ref >59)

## 2023-03-04 LAB — LIPID PANEL WITH LDL/HDL RATIO
Cholesterol, Total: 158 mg/dL (ref 100–199)
HDL: 48 mg/dL (ref >39)
LDL Chol Calc (NIH): 96 mg/dL (ref 0–99)
LDL/HDL Ratio: 2 {ratio} (ref 0.0–3.6)
Triglycerides: 71 mg/dL (ref 0–149)
VLDL Cholesterol Cal: 14 mg/dL (ref 5–40)

## 2023-03-04 LAB — PSA: Prostate Specific Ag, Serum: 1.5 ng/mL (ref 0.0–4.0)

## 2023-03-13 ENCOUNTER — Ambulatory Visit: Payer: PRIVATE HEALTH INSURANCE | Admitting: Family Medicine

## 2023-03-30 ENCOUNTER — Ambulatory Visit: Payer: Self-pay | Admitting: Family Medicine

## 2023-03-30 NOTE — Telephone Encounter (Signed)
 Copied from CRM [id number]. Topic: Clinical - Pink Word Triage >> Mar 30, 2023  5:48 PM Delon T wrote: Reason for Triage: urinating blood, is out of town, please call patient [phone]   Chief Complaint: Hematuria  Symptoms: Blood in urine  Frequency: Two episodes Pertinent Negatives: Patient denies flank pain, pain with urination, fever Disposition: [] ED /[x] Urgent Care (no appt availability in office) / [] Appointment(In office/virtual)/ []  [place] [facility]/ [] Home Care/ [] Refused Recommended Disposition /[] [place] [facility]/ []  Follow-up with PCP Additional Notes: Patient reports he was drinking heavily last night and today noticed blood in his urine. Patient states he has had two episodes of blood in his urine, and has only urinated twice today. He denies any fever, flank pain, abdominal pain, or pain with urination. Patient is out of town and was advised to follow up with Urgent Care or the ED for evaluation and treatment and to increase fluid intake. Patient verbalized understanding.     Reason for Disposition  Blood in urine  (Exception: Could be normal menstrual bleeding.)  Answer Assessment - Initial Assessment Questions 1. COLOR of URINE: Describe the color of the urine.  (e.g., tea-colored, pink, red, bloody) Do you have blood clots in your urine? (e.g., none, pea, grape, small coin)     Dark red bloody 2. ONSET: When did the bleeding start?      This morning  3. EPISODES: How many times has there been blood in the urine? or How many times today?     Twice  4. PAIN with URINATION: Is there any pain with passing your urine? If Yes, ask: How bad is the pain?  (Scale 1-10; or mild, moderate, severe)    - MILD: Complains slightly about urination hurting.    - MODERATE: Interferes with normal activities.      - SEVERE: Excruciating, unwilling or unable to urinate because of the pain.      No 5. FEVER: Do you have a fever? If Yes, ask: What is your  temperature, how was it measured, and when did it start?     No 6. ASSOCIATED SYMPTOMS: Are you passing urine more frequently than usual?     No 7. OTHER SYMPTOMS: Do you have any other symptoms? (e.g., back/flank pain, abdomen pain, vomiting)     No  Protocols used: Urine - Blood In-A-AH

## 2023-03-31 NOTE — Telephone Encounter (Signed)
 I called patient. He did not go to an urgent care. He states he increased his water intake and the blood in his urine resolved. 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 Denies flank pain, fever, chills or sweats.

## 2023-04-01 NOTE — Telephone Encounter (Signed)
 Patient advised to go to urgent care if it re-occurs.

## 2023-04-06 ENCOUNTER — Ambulatory Visit: Payer: Self-pay | Admitting: Family Medicine

## 2023-04-06 NOTE — Telephone Encounter (Signed)
 Patient informed.

## 2023-04-06 NOTE — Telephone Encounter (Signed)
 Chief Complaint: Headache Symptoms: Headache, mild lightheadedness, sinus pressure behind eyes Frequency: 1 day Pertinent Negatives: Patient denies runny nose, cough Disposition: [] ED /[] Urgent Care (no appt availability in office) / [] Appointment(In office/virtual)/ []  [place] [facility]/ [x] Home Care/ [] Refused Recommended Disposition /[]  Mobile Bus/ [x]  Follow-up with PCP Additional Notes: Patient and wife called in stating patient is experiencing a headache that was 10/10 but has mostly resolved with two ibuprofen , blood pressure of 148/82 (which patient states to be normal for him), feeling warm to the touch, and pressure behind eyes. After further evaluation, advised patient to treat symptoms with OTC sinus and cold medication, tylenol  and ibuprofen  and to obtain thermometer to monitor for fever. Patient also added in that he called in for a nurse triage after his birthday where he had an episode of blood in urine that he believed to be from dehydration, but this has since resolved and no further issues. Patient asked for RN to review labs with him from his December appointment. Advised patient I would put in a high priority message to PCP to return phone call to discuss labs.    Copied from CRM [id number]. Topic: Clinical - Red Word Triage >> Apr 06, 2023 12:18 PM Fredrica W wrote: Red Word that prompted transfer to Nurse Triage: Splitting headache, fever - unable to take but feels hot, bp 148/82 heart rate 106.  Light headedness Reason for Disposition  [1] Headache AND [2] has not taken pain medications  Answer Assessment - Initial Assessment Questions 1. LOCATION: Where does it hurt?      Behind eyes and temple 2. ONSET: When did the headache start? (Minutes, hours or days)      This morning 3. PATTERN: Does the pain come and go, or has it been constant since it started?     Today is first day, usually I don't have headaches 4. SEVERITY: How bad is the pain? and  What does it keep you from doing?  (e.g., Scale 1-10; mild, moderate, or severe)   - MILD (1-3): doesn't interfere with normal activities    - MODERATE (4-7): interferes with normal activities or awakens from sleep    - SEVERE (8-10): excruciating pain, unable to do any normal activities        10/10, hurting very badly  5. RECURRENT SYMPTOM: Have you ever had headaches before? If Yes, ask: When was the last time? and What happened that time?      No I usually don't have headaches 6. CAUSE: What do you think is causing the headache?     Unsure 7. MIGRAINE: Have you been diagnosed with migraine headaches? If Yes, ask: Is this headache similar?      No 8. HEAD INJURY: Has there been any recent injury to the head?      No 9. OTHER SYMPTOMS: Do you have any other symptoms? (fever, stiff neck, eye pain, sore throat, cold symptoms)     Lightheaded  Protocols used: Headache-A-AH

## 2023-04-08 ENCOUNTER — Ambulatory Visit: Payer: BC Managed Care – PPO | Admitting: Orthopedic Surgery

## 2023-11-24 ENCOUNTER — Encounter: Payer: Self-pay | Admitting: Sports Medicine

## 2024-01-25 ENCOUNTER — Encounter: Payer: Self-pay | Admitting: Radiology
